# Patient Record
Sex: Male | Born: 1945 | Race: White | Hispanic: No | Smoking: Never smoker
Health system: Southern US, Community
[De-identification: ages and names within clinical notes are randomized; demographics above are authoritative.]

## PROBLEM LIST (undated history)

## (undated) DIAGNOSIS — D649 Anemia, unspecified: Secondary | ICD-10-CM

## (undated) DIAGNOSIS — I10 Essential (primary) hypertension: Secondary | ICD-10-CM

## (undated) DIAGNOSIS — N200 Calculus of kidney: Secondary | ICD-10-CM

## (undated) DIAGNOSIS — M1990 Unspecified osteoarthritis, unspecified site: Secondary | ICD-10-CM

## (undated) HISTORY — PX: TONSILLECTOMY: SUR1361

---

## 2009-11-04 HISTORY — PX: COLONOSCOPY: SHX174

## 2010-05-28 ENCOUNTER — Ambulatory Visit: Payer: Self-pay | Admitting: Unknown Physician Specialty

## 2012-05-15 ENCOUNTER — Ambulatory Visit: Payer: Self-pay | Admitting: Rheumatology

## 2013-10-18 ENCOUNTER — Other Ambulatory Visit: Payer: Self-pay | Admitting: Rheumatology

## 2013-10-18 LAB — BODY FLUID CELL COUNT WITH DIFFERENTIAL
Basophil: 0 %
Eosinophil: 0 %
Neutrophils: 24 %
Nucleated Cell Count: 240 /mm3
Other Mononuclear Cells: 24 %

## 2013-10-18 LAB — SYNOVIAL FLUID, CRYSTAL

## 2014-10-31 DIAGNOSIS — D649 Anemia, unspecified: Secondary | ICD-10-CM | POA: Insufficient documentation

## 2014-10-31 DIAGNOSIS — G629 Polyneuropathy, unspecified: Secondary | ICD-10-CM | POA: Insufficient documentation

## 2014-11-04 HISTORY — PX: EYE SURGERY: SHX253

## 2015-12-11 DIAGNOSIS — R202 Paresthesia of skin: Secondary | ICD-10-CM | POA: Diagnosis not present

## 2015-12-11 DIAGNOSIS — E538 Deficiency of other specified B group vitamins: Secondary | ICD-10-CM | POA: Diagnosis not present

## 2015-12-11 DIAGNOSIS — I10 Essential (primary) hypertension: Secondary | ICD-10-CM | POA: Diagnosis not present

## 2015-12-11 DIAGNOSIS — G252 Other specified forms of tremor: Secondary | ICD-10-CM | POA: Diagnosis not present

## 2016-01-01 DIAGNOSIS — H25011 Cortical age-related cataract, right eye: Secondary | ICD-10-CM | POA: Diagnosis not present

## 2016-01-01 DIAGNOSIS — H35032 Hypertensive retinopathy, left eye: Secondary | ICD-10-CM | POA: Diagnosis not present

## 2016-01-01 DIAGNOSIS — H35371 Puckering of macula, right eye: Secondary | ICD-10-CM | POA: Diagnosis not present

## 2016-01-01 DIAGNOSIS — H25012 Cortical age-related cataract, left eye: Secondary | ICD-10-CM | POA: Diagnosis not present

## 2016-01-01 DIAGNOSIS — H35031 Hypertensive retinopathy, right eye: Secondary | ICD-10-CM | POA: Diagnosis not present

## 2016-01-01 DIAGNOSIS — H2511 Age-related nuclear cataract, right eye: Secondary | ICD-10-CM | POA: Diagnosis not present

## 2016-01-01 DIAGNOSIS — H35372 Puckering of macula, left eye: Secondary | ICD-10-CM | POA: Diagnosis not present

## 2016-01-01 DIAGNOSIS — H2512 Age-related nuclear cataract, left eye: Secondary | ICD-10-CM | POA: Diagnosis not present

## 2016-01-22 DIAGNOSIS — R413 Other amnesia: Secondary | ICD-10-CM | POA: Diagnosis not present

## 2016-01-22 DIAGNOSIS — E538 Deficiency of other specified B group vitamins: Secondary | ICD-10-CM | POA: Insufficient documentation

## 2016-01-22 DIAGNOSIS — G250 Essential tremor: Secondary | ICD-10-CM | POA: Diagnosis not present

## 2016-01-30 DIAGNOSIS — H2512 Age-related nuclear cataract, left eye: Secondary | ICD-10-CM | POA: Diagnosis not present

## 2016-02-12 DIAGNOSIS — H2511 Age-related nuclear cataract, right eye: Secondary | ICD-10-CM | POA: Diagnosis not present

## 2016-02-12 DIAGNOSIS — H25011 Cortical age-related cataract, right eye: Secondary | ICD-10-CM | POA: Diagnosis not present

## 2016-02-19 DIAGNOSIS — I10 Essential (primary) hypertension: Secondary | ICD-10-CM | POA: Diagnosis not present

## 2016-02-19 DIAGNOSIS — D649 Anemia, unspecified: Secondary | ICD-10-CM | POA: Diagnosis not present

## 2016-02-19 DIAGNOSIS — N183 Chronic kidney disease, stage 3 (moderate): Secondary | ICD-10-CM | POA: Diagnosis not present

## 2016-02-19 DIAGNOSIS — M109 Gout, unspecified: Secondary | ICD-10-CM | POA: Diagnosis not present

## 2016-02-19 DIAGNOSIS — N200 Calculus of kidney: Secondary | ICD-10-CM | POA: Diagnosis not present

## 2016-02-19 DIAGNOSIS — N2581 Secondary hyperparathyroidism of renal origin: Secondary | ICD-10-CM | POA: Diagnosis not present

## 2016-02-20 DIAGNOSIS — H2511 Age-related nuclear cataract, right eye: Secondary | ICD-10-CM | POA: Diagnosis not present

## 2016-02-26 DIAGNOSIS — M109 Gout, unspecified: Secondary | ICD-10-CM | POA: Diagnosis not present

## 2016-02-26 DIAGNOSIS — I10 Essential (primary) hypertension: Secondary | ICD-10-CM | POA: Diagnosis not present

## 2016-02-26 DIAGNOSIS — N183 Chronic kidney disease, stage 3 (moderate): Secondary | ICD-10-CM | POA: Diagnosis not present

## 2016-03-18 DIAGNOSIS — E538 Deficiency of other specified B group vitamins: Secondary | ICD-10-CM | POA: Diagnosis not present

## 2016-03-18 DIAGNOSIS — G250 Essential tremor: Secondary | ICD-10-CM | POA: Diagnosis not present

## 2016-06-19 DIAGNOSIS — N189 Chronic kidney disease, unspecified: Secondary | ICD-10-CM | POA: Diagnosis not present

## 2016-06-19 DIAGNOSIS — D538 Other specified nutritional anemias: Secondary | ICD-10-CM | POA: Diagnosis not present

## 2016-06-19 DIAGNOSIS — N200 Calculus of kidney: Secondary | ICD-10-CM | POA: Diagnosis not present

## 2016-06-19 DIAGNOSIS — I10 Essential (primary) hypertension: Secondary | ICD-10-CM | POA: Diagnosis not present

## 2016-06-19 DIAGNOSIS — Z0000 Encounter for general adult medical examination without abnormal findings: Secondary | ICD-10-CM | POA: Diagnosis not present

## 2016-06-24 DIAGNOSIS — E538 Deficiency of other specified B group vitamins: Secondary | ICD-10-CM | POA: Diagnosis not present

## 2016-06-24 DIAGNOSIS — N183 Chronic kidney disease, stage 3 (moderate): Secondary | ICD-10-CM | POA: Diagnosis not present

## 2016-06-24 DIAGNOSIS — I10 Essential (primary) hypertension: Secondary | ICD-10-CM | POA: Diagnosis not present

## 2016-06-24 DIAGNOSIS — D538 Other specified nutritional anemias: Secondary | ICD-10-CM | POA: Diagnosis not present

## 2016-06-24 DIAGNOSIS — G250 Essential tremor: Secondary | ICD-10-CM | POA: Diagnosis not present

## 2016-07-17 DIAGNOSIS — D649 Anemia, unspecified: Secondary | ICD-10-CM | POA: Diagnosis not present

## 2016-08-19 DIAGNOSIS — G252 Other specified forms of tremor: Secondary | ICD-10-CM | POA: Diagnosis not present

## 2016-08-19 DIAGNOSIS — R413 Other amnesia: Secondary | ICD-10-CM | POA: Diagnosis not present

## 2016-08-19 DIAGNOSIS — E538 Deficiency of other specified B group vitamins: Secondary | ICD-10-CM | POA: Diagnosis not present

## 2016-08-26 DIAGNOSIS — D649 Anemia, unspecified: Secondary | ICD-10-CM | POA: Diagnosis not present

## 2016-08-26 DIAGNOSIS — N2581 Secondary hyperparathyroidism of renal origin: Secondary | ICD-10-CM | POA: Diagnosis not present

## 2016-08-26 DIAGNOSIS — M109 Gout, unspecified: Secondary | ICD-10-CM | POA: Diagnosis not present

## 2016-08-26 DIAGNOSIS — N200 Calculus of kidney: Secondary | ICD-10-CM | POA: Diagnosis not present

## 2016-08-26 DIAGNOSIS — N183 Chronic kidney disease, stage 3 (moderate): Secondary | ICD-10-CM | POA: Diagnosis not present

## 2016-08-26 DIAGNOSIS — I10 Essential (primary) hypertension: Secondary | ICD-10-CM | POA: Diagnosis not present

## 2016-09-02 DIAGNOSIS — I129 Hypertensive chronic kidney disease with stage 1 through stage 4 chronic kidney disease, or unspecified chronic kidney disease: Secondary | ICD-10-CM | POA: Diagnosis not present

## 2016-09-02 DIAGNOSIS — M109 Gout, unspecified: Secondary | ICD-10-CM | POA: Diagnosis not present

## 2016-09-02 DIAGNOSIS — N183 Chronic kidney disease, stage 3 (moderate): Secondary | ICD-10-CM | POA: Diagnosis not present

## 2016-09-23 DIAGNOSIS — H26493 Other secondary cataract, bilateral: Secondary | ICD-10-CM | POA: Diagnosis not present

## 2016-12-23 DIAGNOSIS — G250 Essential tremor: Secondary | ICD-10-CM | POA: Diagnosis not present

## 2016-12-23 DIAGNOSIS — N183 Chronic kidney disease, stage 3 (moderate): Secondary | ICD-10-CM | POA: Diagnosis not present

## 2016-12-23 DIAGNOSIS — I10 Essential (primary) hypertension: Secondary | ICD-10-CM | POA: Diagnosis not present

## 2016-12-23 DIAGNOSIS — E538 Deficiency of other specified B group vitamins: Secondary | ICD-10-CM | POA: Diagnosis not present

## 2016-12-30 DIAGNOSIS — I10 Essential (primary) hypertension: Secondary | ICD-10-CM | POA: Diagnosis not present

## 2016-12-30 DIAGNOSIS — N200 Calculus of kidney: Secondary | ICD-10-CM | POA: Diagnosis not present

## 2016-12-30 DIAGNOSIS — Z0000 Encounter for general adult medical examination without abnormal findings: Secondary | ICD-10-CM | POA: Diagnosis not present

## 2016-12-30 DIAGNOSIS — N183 Chronic kidney disease, stage 3 (moderate): Secondary | ICD-10-CM | POA: Diagnosis not present

## 2017-03-03 DIAGNOSIS — N2581 Secondary hyperparathyroidism of renal origin: Secondary | ICD-10-CM | POA: Diagnosis not present

## 2017-03-03 DIAGNOSIS — N183 Chronic kidney disease, stage 3 (moderate): Secondary | ICD-10-CM | POA: Diagnosis not present

## 2017-03-03 DIAGNOSIS — M109 Gout, unspecified: Secondary | ICD-10-CM | POA: Diagnosis not present

## 2017-03-03 DIAGNOSIS — D649 Anemia, unspecified: Secondary | ICD-10-CM | POA: Diagnosis not present

## 2017-03-10 DIAGNOSIS — N183 Chronic kidney disease, stage 3 (moderate): Secondary | ICD-10-CM | POA: Diagnosis not present

## 2017-03-10 DIAGNOSIS — I129 Hypertensive chronic kidney disease with stage 1 through stage 4 chronic kidney disease, or unspecified chronic kidney disease: Secondary | ICD-10-CM | POA: Diagnosis not present

## 2017-03-10 DIAGNOSIS — M109 Gout, unspecified: Secondary | ICD-10-CM | POA: Diagnosis not present

## 2017-04-21 DIAGNOSIS — L80 Vitiligo: Secondary | ICD-10-CM | POA: Diagnosis not present

## 2017-06-24 DIAGNOSIS — Z1159 Encounter for screening for other viral diseases: Secondary | ICD-10-CM | POA: Diagnosis not present

## 2017-06-24 DIAGNOSIS — I10 Essential (primary) hypertension: Secondary | ICD-10-CM | POA: Diagnosis not present

## 2017-06-24 DIAGNOSIS — N183 Chronic kidney disease, stage 3 (moderate): Secondary | ICD-10-CM | POA: Diagnosis not present

## 2017-06-24 DIAGNOSIS — N200 Calculus of kidney: Secondary | ICD-10-CM | POA: Diagnosis not present

## 2017-06-30 DIAGNOSIS — G629 Polyneuropathy, unspecified: Secondary | ICD-10-CM | POA: Diagnosis not present

## 2017-06-30 DIAGNOSIS — I10 Essential (primary) hypertension: Secondary | ICD-10-CM | POA: Diagnosis not present

## 2017-06-30 DIAGNOSIS — L80 Vitiligo: Secondary | ICD-10-CM | POA: Insufficient documentation

## 2017-06-30 DIAGNOSIS — N183 Chronic kidney disease, stage 3 (moderate): Secondary | ICD-10-CM | POA: Diagnosis not present

## 2017-08-11 DIAGNOSIS — H26493 Other secondary cataract, bilateral: Secondary | ICD-10-CM | POA: Diagnosis not present

## 2017-09-01 DIAGNOSIS — E538 Deficiency of other specified B group vitamins: Secondary | ICD-10-CM | POA: Diagnosis not present

## 2017-09-01 DIAGNOSIS — R251 Tremor, unspecified: Secondary | ICD-10-CM | POA: Insufficient documentation

## 2017-09-01 DIAGNOSIS — R413 Other amnesia: Secondary | ICD-10-CM | POA: Diagnosis not present

## 2017-09-08 DIAGNOSIS — N183 Chronic kidney disease, stage 3 (moderate): Secondary | ICD-10-CM | POA: Diagnosis not present

## 2017-09-08 DIAGNOSIS — M109 Gout, unspecified: Secondary | ICD-10-CM | POA: Diagnosis not present

## 2017-09-08 DIAGNOSIS — N2581 Secondary hyperparathyroidism of renal origin: Secondary | ICD-10-CM | POA: Diagnosis not present

## 2017-09-08 DIAGNOSIS — D649 Anemia, unspecified: Secondary | ICD-10-CM | POA: Diagnosis not present

## 2017-09-15 DIAGNOSIS — M109 Gout, unspecified: Secondary | ICD-10-CM | POA: Diagnosis not present

## 2017-09-15 DIAGNOSIS — N183 Chronic kidney disease, stage 3 (moderate): Secondary | ICD-10-CM | POA: Diagnosis not present

## 2017-09-15 DIAGNOSIS — I129 Hypertensive chronic kidney disease with stage 1 through stage 4 chronic kidney disease, or unspecified chronic kidney disease: Secondary | ICD-10-CM | POA: Diagnosis not present

## 2017-09-29 DIAGNOSIS — N183 Chronic kidney disease, stage 3 (moderate): Secondary | ICD-10-CM | POA: Diagnosis not present

## 2017-09-29 DIAGNOSIS — E7801 Familial hypercholesterolemia: Secondary | ICD-10-CM | POA: Diagnosis not present

## 2017-09-29 DIAGNOSIS — L300 Nummular dermatitis: Secondary | ICD-10-CM | POA: Diagnosis not present

## 2017-09-29 DIAGNOSIS — L821 Other seborrheic keratosis: Secondary | ICD-10-CM | POA: Diagnosis not present

## 2017-09-29 DIAGNOSIS — L80 Vitiligo: Secondary | ICD-10-CM | POA: Diagnosis not present

## 2017-09-29 DIAGNOSIS — L853 Xerosis cutis: Secondary | ICD-10-CM | POA: Diagnosis not present

## 2017-11-17 DIAGNOSIS — R251 Tremor, unspecified: Secondary | ICD-10-CM | POA: Diagnosis not present

## 2017-11-17 DIAGNOSIS — E538 Deficiency of other specified B group vitamins: Secondary | ICD-10-CM | POA: Diagnosis not present

## 2017-11-17 DIAGNOSIS — R413 Other amnesia: Secondary | ICD-10-CM | POA: Diagnosis not present

## 2017-12-22 DIAGNOSIS — E7801 Familial hypercholesterolemia: Secondary | ICD-10-CM | POA: Diagnosis not present

## 2017-12-22 DIAGNOSIS — N183 Chronic kidney disease, stage 3 (moderate): Secondary | ICD-10-CM | POA: Diagnosis not present

## 2017-12-29 DIAGNOSIS — N183 Chronic kidney disease, stage 3 (moderate): Secondary | ICD-10-CM | POA: Diagnosis not present

## 2017-12-29 DIAGNOSIS — G629 Polyneuropathy, unspecified: Secondary | ICD-10-CM | POA: Diagnosis not present

## 2017-12-29 DIAGNOSIS — I10 Essential (primary) hypertension: Secondary | ICD-10-CM | POA: Diagnosis not present

## 2017-12-29 DIAGNOSIS — Z0000 Encounter for general adult medical examination without abnormal findings: Secondary | ICD-10-CM | POA: Diagnosis not present

## 2018-03-05 DIAGNOSIS — N183 Chronic kidney disease, stage 3 (moderate): Secondary | ICD-10-CM | POA: Diagnosis not present

## 2018-03-05 DIAGNOSIS — I10 Essential (primary) hypertension: Secondary | ICD-10-CM | POA: Diagnosis not present

## 2018-03-05 DIAGNOSIS — M109 Gout, unspecified: Secondary | ICD-10-CM | POA: Diagnosis not present

## 2018-03-05 DIAGNOSIS — N2581 Secondary hyperparathyroidism of renal origin: Secondary | ICD-10-CM | POA: Diagnosis not present

## 2018-03-09 DIAGNOSIS — N183 Chronic kidney disease, stage 3 (moderate): Secondary | ICD-10-CM | POA: Diagnosis not present

## 2018-03-09 DIAGNOSIS — I129 Hypertensive chronic kidney disease with stage 1 through stage 4 chronic kidney disease, or unspecified chronic kidney disease: Secondary | ICD-10-CM | POA: Diagnosis not present

## 2018-03-09 DIAGNOSIS — M109 Gout, unspecified: Secondary | ICD-10-CM | POA: Diagnosis not present

## 2018-04-06 DIAGNOSIS — L80 Vitiligo: Secondary | ICD-10-CM | POA: Diagnosis not present

## 2018-05-18 DIAGNOSIS — R413 Other amnesia: Secondary | ICD-10-CM | POA: Diagnosis not present

## 2018-05-18 DIAGNOSIS — E538 Deficiency of other specified B group vitamins: Secondary | ICD-10-CM | POA: Diagnosis not present

## 2018-05-18 DIAGNOSIS — R251 Tremor, unspecified: Secondary | ICD-10-CM | POA: Diagnosis not present

## 2018-06-22 DIAGNOSIS — I10 Essential (primary) hypertension: Secondary | ICD-10-CM | POA: Diagnosis not present

## 2018-06-22 DIAGNOSIS — N183 Chronic kidney disease, stage 3 (moderate): Secondary | ICD-10-CM | POA: Diagnosis not present

## 2018-06-22 DIAGNOSIS — E538 Deficiency of other specified B group vitamins: Secondary | ICD-10-CM | POA: Diagnosis not present

## 2018-06-22 DIAGNOSIS — G629 Polyneuropathy, unspecified: Secondary | ICD-10-CM | POA: Diagnosis not present

## 2018-06-29 DIAGNOSIS — G629 Polyneuropathy, unspecified: Secondary | ICD-10-CM | POA: Diagnosis not present

## 2018-06-29 DIAGNOSIS — N183 Chronic kidney disease, stage 3 (moderate): Secondary | ICD-10-CM | POA: Diagnosis not present

## 2018-06-29 DIAGNOSIS — E538 Deficiency of other specified B group vitamins: Secondary | ICD-10-CM | POA: Diagnosis not present

## 2018-06-29 DIAGNOSIS — I10 Essential (primary) hypertension: Secondary | ICD-10-CM | POA: Diagnosis not present

## 2018-08-24 DIAGNOSIS — E538 Deficiency of other specified B group vitamins: Secondary | ICD-10-CM | POA: Diagnosis not present

## 2018-08-24 DIAGNOSIS — R413 Other amnesia: Secondary | ICD-10-CM | POA: Diagnosis not present

## 2018-08-24 DIAGNOSIS — R251 Tremor, unspecified: Secondary | ICD-10-CM | POA: Diagnosis not present

## 2018-08-28 DIAGNOSIS — I10 Essential (primary) hypertension: Secondary | ICD-10-CM | POA: Diagnosis not present

## 2018-08-28 DIAGNOSIS — N183 Chronic kidney disease, stage 3 (moderate): Secondary | ICD-10-CM | POA: Diagnosis not present

## 2018-08-28 DIAGNOSIS — N200 Calculus of kidney: Secondary | ICD-10-CM | POA: Diagnosis not present

## 2018-08-28 DIAGNOSIS — M109 Gout, unspecified: Secondary | ICD-10-CM | POA: Diagnosis not present

## 2018-09-07 DIAGNOSIS — N183 Chronic kidney disease, stage 3 (moderate): Secondary | ICD-10-CM | POA: Diagnosis not present

## 2018-09-07 DIAGNOSIS — I129 Hypertensive chronic kidney disease with stage 1 through stage 4 chronic kidney disease, or unspecified chronic kidney disease: Secondary | ICD-10-CM | POA: Diagnosis not present

## 2018-09-07 DIAGNOSIS — M109 Gout, unspecified: Secondary | ICD-10-CM | POA: Diagnosis not present

## 2018-11-02 DIAGNOSIS — L80 Vitiligo: Secondary | ICD-10-CM | POA: Diagnosis not present

## 2018-11-02 DIAGNOSIS — L300 Nummular dermatitis: Secondary | ICD-10-CM | POA: Diagnosis not present

## 2018-11-23 DIAGNOSIS — R413 Other amnesia: Secondary | ICD-10-CM | POA: Diagnosis not present

## 2018-11-23 DIAGNOSIS — F419 Anxiety disorder, unspecified: Secondary | ICD-10-CM | POA: Diagnosis not present

## 2018-11-23 DIAGNOSIS — E538 Deficiency of other specified B group vitamins: Secondary | ICD-10-CM | POA: Diagnosis not present

## 2018-11-23 DIAGNOSIS — R251 Tremor, unspecified: Secondary | ICD-10-CM | POA: Diagnosis not present

## 2018-12-28 DIAGNOSIS — N183 Chronic kidney disease, stage 3 (moderate): Secondary | ICD-10-CM | POA: Diagnosis not present

## 2018-12-28 DIAGNOSIS — E785 Hyperlipidemia, unspecified: Secondary | ICD-10-CM | POA: Diagnosis not present

## 2019-01-04 DIAGNOSIS — Z0000 Encounter for general adult medical examination without abnormal findings: Secondary | ICD-10-CM | POA: Diagnosis not present

## 2019-03-31 DIAGNOSIS — D649 Anemia, unspecified: Secondary | ICD-10-CM | POA: Diagnosis not present

## 2019-03-31 DIAGNOSIS — N183 Chronic kidney disease, stage 3 (moderate): Secondary | ICD-10-CM | POA: Diagnosis not present

## 2019-03-31 DIAGNOSIS — M109 Gout, unspecified: Secondary | ICD-10-CM | POA: Diagnosis not present

## 2019-03-31 DIAGNOSIS — N2581 Secondary hyperparathyroidism of renal origin: Secondary | ICD-10-CM | POA: Diagnosis not present

## 2019-04-05 DIAGNOSIS — M109 Gout, unspecified: Secondary | ICD-10-CM | POA: Diagnosis not present

## 2019-04-05 DIAGNOSIS — N183 Chronic kidney disease, stage 3 (moderate): Secondary | ICD-10-CM | POA: Diagnosis not present

## 2019-04-05 DIAGNOSIS — I129 Hypertensive chronic kidney disease with stage 1 through stage 4 chronic kidney disease, or unspecified chronic kidney disease: Secondary | ICD-10-CM | POA: Diagnosis not present

## 2019-06-28 DIAGNOSIS — N183 Chronic kidney disease, stage 3 (moderate): Secondary | ICD-10-CM | POA: Diagnosis not present

## 2019-07-05 DIAGNOSIS — N183 Chronic kidney disease, stage 3 (moderate): Secondary | ICD-10-CM | POA: Diagnosis not present

## 2019-07-05 DIAGNOSIS — I10 Essential (primary) hypertension: Secondary | ICD-10-CM | POA: Diagnosis not present

## 2019-07-05 DIAGNOSIS — R413 Other amnesia: Secondary | ICD-10-CM | POA: Diagnosis not present

## 2019-07-05 DIAGNOSIS — G629 Polyneuropathy, unspecified: Secondary | ICD-10-CM | POA: Diagnosis not present

## 2019-10-11 DIAGNOSIS — N1832 Chronic kidney disease, stage 3b: Secondary | ICD-10-CM | POA: Insufficient documentation

## 2019-10-11 DIAGNOSIS — I129 Hypertensive chronic kidney disease with stage 1 through stage 4 chronic kidney disease, or unspecified chronic kidney disease: Secondary | ICD-10-CM | POA: Insufficient documentation

## 2019-10-11 DIAGNOSIS — M109 Gout, unspecified: Secondary | ICD-10-CM | POA: Insufficient documentation

## 2020-05-18 ENCOUNTER — Other Ambulatory Visit: Payer: Self-pay | Admitting: Internal Medicine

## 2020-05-18 ENCOUNTER — Other Ambulatory Visit: Payer: Self-pay

## 2020-05-18 ENCOUNTER — Encounter: Payer: Self-pay | Admitting: Emergency Medicine

## 2020-05-18 ENCOUNTER — Inpatient Hospital Stay
Admission: EM | Admit: 2020-05-18 | Discharge: 2020-05-20 | DRG: 854 | Disposition: A | Discharge status: Home / Self Care | Payer: Medicare Other | Attending: Internal Medicine | Admitting: Internal Medicine

## 2020-05-18 ENCOUNTER — Ambulatory Visit
Admission: RE | Admit: 2020-05-18 | Discharge: 2020-05-18 | Disposition: A | Discharge status: Home / Self Care | Payer: Medicare Other | Source: Ambulatory Visit | Attending: Internal Medicine | Admitting: Internal Medicine

## 2020-05-18 DIAGNOSIS — A419 Sepsis, unspecified organism: Principal | ICD-10-CM | POA: Diagnosis present

## 2020-05-18 DIAGNOSIS — D72829 Elevated white blood cell count, unspecified: Secondary | ICD-10-CM

## 2020-05-18 DIAGNOSIS — N139 Obstructive and reflux uropathy, unspecified: Secondary | ICD-10-CM

## 2020-05-18 DIAGNOSIS — E875 Hyperkalemia: Secondary | ICD-10-CM | POA: Diagnosis present

## 2020-05-18 DIAGNOSIS — R1032 Left lower quadrant pain: Secondary | ICD-10-CM

## 2020-05-18 DIAGNOSIS — N200 Calculus of kidney: Secondary | ICD-10-CM | POA: Insufficient documentation

## 2020-05-18 DIAGNOSIS — N183 Chronic kidney disease, stage 3 unspecified: Secondary | ICD-10-CM | POA: Diagnosis present

## 2020-05-18 DIAGNOSIS — M109 Gout, unspecified: Secondary | ICD-10-CM | POA: Diagnosis present

## 2020-05-18 DIAGNOSIS — R109 Unspecified abdominal pain: Secondary | ICD-10-CM | POA: Diagnosis not present

## 2020-05-18 DIAGNOSIS — E785 Hyperlipidemia, unspecified: Secondary | ICD-10-CM | POA: Diagnosis present

## 2020-05-18 DIAGNOSIS — N179 Acute kidney failure, unspecified: Secondary | ICD-10-CM

## 2020-05-18 DIAGNOSIS — F419 Anxiety disorder, unspecified: Secondary | ICD-10-CM | POA: Diagnosis present

## 2020-05-18 DIAGNOSIS — Z20822 Contact with and (suspected) exposure to covid-19: Secondary | ICD-10-CM | POA: Diagnosis present

## 2020-05-18 DIAGNOSIS — N136 Pyonephrosis: Secondary | ICD-10-CM | POA: Diagnosis present

## 2020-05-18 DIAGNOSIS — I129 Hypertensive chronic kidney disease with stage 1 through stage 4 chronic kidney disease, or unspecified chronic kidney disease: Secondary | ICD-10-CM | POA: Diagnosis present

## 2020-05-18 HISTORY — DX: Calculus of kidney: N20.0

## 2020-05-18 LAB — BASIC METABOLIC PANEL
Anion gap: 11 (ref 5–15)
BUN: 56 mg/dL — ABNORMAL HIGH (ref 8–23)
CO2: 22 mmol/L (ref 22–32)
Calcium: 9.3 mg/dL (ref 8.9–10.3)
Chloride: 102 mmol/L (ref 98–111)
Creatinine, Ser: 5.88 mg/dL — ABNORMAL HIGH (ref 0.61–1.24)
GFR calc Af Amer: 10 mL/min — ABNORMAL LOW (ref >60)
GFR calc non Af Amer: 9 mL/min — ABNORMAL LOW (ref >60)
Glucose, Bld: 115 mg/dL — ABNORMAL HIGH (ref 70–99)
Potassium: 5.2 mmol/L — ABNORMAL HIGH (ref 3.5–5.1)
Sodium: 135 mmol/L (ref 135–145)

## 2020-05-18 LAB — CBC
HCT: 42.1 % (ref 39.0–52.0)
Hemoglobin: 14.4 g/dL (ref 13.0–17.0)
MCH: 32.7 pg (ref 26.0–34.0)
MCHC: 34.2 g/dL (ref 30.0–36.0)
MCV: 95.7 fL (ref 80.0–100.0)
Platelets: 200 10*3/uL (ref 150–400)
RBC: 4.4 MIL/uL (ref 4.22–5.81)
RDW: 13.5 % (ref 11.5–15.5)
WBC: 20.3 10*3/uL — ABNORMAL HIGH (ref 4.0–10.5)
nRBC: 0 % (ref 0.0–0.2)

## 2020-05-18 NOTE — ED Triage Notes (Signed)
First Nurse Note:  Arrives for ED evaluation of kidney stone, hydronephrosis, and AKI.  Per report patient has had outpatient CT scan and blood work.  Creatinine elevated per report.  Unable to locate outpatient bloodwork results.

## 2020-05-18 NOTE — ED Triage Notes (Signed)
Pt here with c/o kidney stones bilateral kidneys per CT scan earlier today, left hydronephrosis, c/o LLQ abd pain, no painful urination today, just decreased. Hx of kidney stones years ago, hasn't had any since. Pt walked to triage with no concerns, NAD.

## 2020-05-18 NOTE — ED Notes (Signed)
Lab work drawn at [facility] earlier today, unable to find results at this time, however, pt states results had come back to the dr prior to his arrival to ED.

## 2020-05-19 ENCOUNTER — Encounter: Payer: Self-pay | Admitting: Urology

## 2020-05-19 ENCOUNTER — Inpatient Hospital Stay: Payer: Medicare Other | Admitting: Anesthesiology

## 2020-05-19 ENCOUNTER — Encounter
Admission: EM | Disposition: A | Discharge status: Home / Self Care | Payer: Self-pay | Source: Home / Self Care | Attending: Internal Medicine

## 2020-05-19 DIAGNOSIS — N390 Urinary tract infection, site not specified: Secondary | ICD-10-CM

## 2020-05-19 DIAGNOSIS — I10 Essential (primary) hypertension: Secondary | ICD-10-CM | POA: Diagnosis not present

## 2020-05-19 DIAGNOSIS — R109 Unspecified abdominal pain: Secondary | ICD-10-CM | POA: Diagnosis present

## 2020-05-19 DIAGNOSIS — E785 Hyperlipidemia, unspecified: Secondary | ICD-10-CM | POA: Diagnosis present

## 2020-05-19 DIAGNOSIS — N179 Acute kidney failure, unspecified: Secondary | ICD-10-CM | POA: Diagnosis present

## 2020-05-19 DIAGNOSIS — N183 Chronic kidney disease, stage 3 unspecified: Secondary | ICD-10-CM | POA: Diagnosis present

## 2020-05-19 DIAGNOSIS — F419 Anxiety disorder, unspecified: Secondary | ICD-10-CM | POA: Diagnosis present

## 2020-05-19 DIAGNOSIS — E875 Hyperkalemia: Secondary | ICD-10-CM

## 2020-05-19 DIAGNOSIS — I129 Hypertensive chronic kidney disease with stage 1 through stage 4 chronic kidney disease, or unspecified chronic kidney disease: Secondary | ICD-10-CM | POA: Diagnosis present

## 2020-05-19 DIAGNOSIS — N201 Calculus of ureter: Secondary | ICD-10-CM

## 2020-05-19 DIAGNOSIS — M109 Gout, unspecified: Secondary | ICD-10-CM | POA: Diagnosis present

## 2020-05-19 DIAGNOSIS — N139 Obstructive and reflux uropathy, unspecified: Secondary | ICD-10-CM | POA: Diagnosis present

## 2020-05-19 DIAGNOSIS — A419 Sepsis, unspecified organism: Secondary | ICD-10-CM | POA: Diagnosis present

## 2020-05-19 DIAGNOSIS — N136 Pyonephrosis: Secondary | ICD-10-CM | POA: Diagnosis present

## 2020-05-19 DIAGNOSIS — Z20822 Contact with and (suspected) exposure to covid-19: Secondary | ICD-10-CM | POA: Diagnosis present

## 2020-05-19 DIAGNOSIS — D72829 Elevated white blood cell count, unspecified: Secondary | ICD-10-CM | POA: Diagnosis not present

## 2020-05-19 HISTORY — PX: CYSTOSCOPY WITH STENT PLACEMENT: SHX5790

## 2020-05-19 HISTORY — PX: CYSTOSCOPY W/ RETROGRADES: SHX1426

## 2020-05-19 LAB — URINALYSIS, COMPLETE (UACMP) WITH MICROSCOPIC
Bacteria, UA: NONE SEEN
Bilirubin Urine: NEGATIVE
Glucose, UA: 50 mg/dL — AB
Ketones, ur: NEGATIVE mg/dL
Nitrite: NEGATIVE
Protein, ur: >=300 mg/dL — AB
RBC / HPF: >50 RBC/hpf — ABNORMAL HIGH (ref 0–5)
Specific Gravity, Urine: 1.009 (ref 1.005–1.030)
WBC, UA: >50 WBC/hpf — ABNORMAL HIGH (ref 0–5)
pH: 7 (ref 5.0–8.0)

## 2020-05-19 LAB — BASIC METABOLIC PANEL
Anion gap: 11 (ref 5–15)
Anion gap: 8 (ref 5–15)
BUN: 56 mg/dL — ABNORMAL HIGH (ref 8–23)
BUN: 53 mg/dL — ABNORMAL HIGH (ref 8–23)
CO2: 20 mmol/L — ABNORMAL LOW (ref 22–32)
CO2: 22 mmol/L (ref 22–32)
Calcium: 8.5 mg/dL — ABNORMAL LOW (ref 8.9–10.3)
Calcium: 8.3 mg/dL — ABNORMAL LOW (ref 8.9–10.3)
Chloride: 105 mmol/L (ref 98–111)
Chloride: 106 mmol/L (ref 98–111)
Creatinine, Ser: 6.2 mg/dL — ABNORMAL HIGH (ref 0.61–1.24)
Creatinine, Ser: 4.89 mg/dL — ABNORMAL HIGH (ref 0.61–1.24)
GFR calc Af Amer: 9 mL/min — ABNORMAL LOW (ref >60)
GFR calc Af Amer: 12 mL/min — ABNORMAL LOW (ref >60)
GFR calc non Af Amer: 8 mL/min — ABNORMAL LOW (ref >60)
GFR calc non Af Amer: 11 mL/min — ABNORMAL LOW (ref >60)
Glucose, Bld: 118 mg/dL — ABNORMAL HIGH (ref 70–99)
Glucose, Bld: 107 mg/dL — ABNORMAL HIGH (ref 70–99)
Potassium: 4.9 mmol/L (ref 3.5–5.1)
Potassium: 4.5 mmol/L (ref 3.5–5.1)
Sodium: 136 mmol/L (ref 135–145)
Sodium: 136 mmol/L (ref 135–145)

## 2020-05-19 LAB — LIPASE, BLOOD: Lipase: 25 U/L (ref 11–51)

## 2020-05-19 LAB — CBC
HCT: 35.8 % — ABNORMAL LOW (ref 39.0–52.0)
Hemoglobin: 12.3 g/dL — ABNORMAL LOW (ref 13.0–17.0)
MCH: 33.1 pg (ref 26.0–34.0)
MCHC: 34.4 g/dL (ref 30.0–36.0)
MCV: 96.2 fL (ref 80.0–100.0)
Platelets: 155 10*3/uL (ref 150–400)
RBC: 3.72 MIL/uL — ABNORMAL LOW (ref 4.22–5.81)
RDW: 13.4 % (ref 11.5–15.5)
WBC: 20.3 10*3/uL — ABNORMAL HIGH (ref 4.0–10.5)
nRBC: 0 % (ref 0.0–0.2)

## 2020-05-19 LAB — SARS CORONAVIRUS 2 BY RT PCR (HOSPITAL ORDER, PERFORMED IN ~~LOC~~ HOSPITAL LAB): SARS Coronavirus 2: NEGATIVE

## 2020-05-19 LAB — HEPATIC FUNCTION PANEL
ALT: 31 U/L (ref 0–44)
AST: 48 U/L — ABNORMAL HIGH (ref 15–41)
Albumin: 4.4 g/dL (ref 3.5–5.0)
Alkaline Phosphatase: 67 U/L (ref 38–126)
Bilirubin, Direct: 0.4 mg/dL — ABNORMAL HIGH (ref 0.0–0.2)
Indirect Bilirubin: 2.4 mg/dL — ABNORMAL HIGH (ref 0.3–0.9)
Total Bilirubin: 2.8 mg/dL — ABNORMAL HIGH (ref 0.3–1.2)
Total Protein: 7.9 g/dL (ref 6.5–8.1)

## 2020-05-19 LAB — LACTIC ACID, PLASMA
Lactic Acid, Venous: 1.4 mmol/L (ref 0.5–1.9)
Lactic Acid, Venous: 1.5 mmol/L (ref 0.5–1.9)
Lactic Acid, Venous: 1.6 mmol/L (ref 0.5–1.9)
Lactic Acid, Venous: 1.6 mmol/L (ref 0.5–1.9)

## 2020-05-19 LAB — PROCALCITONIN: Procalcitonin: <0.1 ng/mL

## 2020-05-19 SURGERY — CYSTOSCOPY, WITH STENT INSERTION
Anesthesia: General | Laterality: Left

## 2020-05-19 MED ORDER — SODIUM CHLORIDE 0.9 % IV SOLN
1.0000 g | INTRAVENOUS | Status: DC
  Administered 2020-05-19: 1 g via INTRAVENOUS
  Filled 2020-05-19: qty 10

## 2020-05-19 MED ORDER — LACTATED RINGERS IV BOLUS (SEPSIS)
1000.0000 mL | Freq: Once | INTRAVENOUS | Status: AC
  Administered 2020-05-19: 1000 mL via INTRAVENOUS

## 2020-05-19 MED ORDER — FENTANYL CITRATE (PF) 100 MCG/2ML IJ SOLN
INTRAMUSCULAR | Status: DC | PRN
  Administered 2020-05-19 (×2): 50 ug via INTRAVENOUS

## 2020-05-19 MED ORDER — ENOXAPARIN SODIUM 40 MG/0.4ML ~~LOC~~ SOLN: 40.0000 mg | SUBCUTANEOUS | Status: DC

## 2020-05-19 MED ORDER — TRAZODONE HCL 50 MG PO TABS: 25.0000 mg | ORAL_TABLET | Freq: Every evening | ORAL | Status: DC | PRN

## 2020-05-19 MED ORDER — PROPOFOL 10 MG/ML IV BOLUS
INTRAVENOUS | Status: DC | PRN
  Administered 2020-05-19: 150 mg via INTRAVENOUS

## 2020-05-19 MED ORDER — SODIUM CHLORIDE 0.9 % IV SOLN
INTRAVENOUS | Status: DC | PRN
Start: 2020-05-19 — End: 2020-05-19

## 2020-05-19 MED ORDER — CHLORHEXIDINE GLUCONATE CLOTH 2 % EX PADS
6.0000 | MEDICATED_PAD | Freq: Every day | CUTANEOUS | Status: DC
  Administered 2020-05-19 – 2020-05-20 (×2): 6 via TOPICAL

## 2020-05-19 MED ORDER — ONDANSETRON HCL 4 MG/2ML IJ SOLN
INTRAMUSCULAR | Status: DC | PRN
  Administered 2020-05-19: 4 mg via INTRAVENOUS

## 2020-05-19 MED ORDER — VITAMIN B-12 1000 MCG PO TABS
1000.0000 ug | ORAL_TABLET | Freq: Every day | ORAL | Status: DC
  Administered 2020-05-19 – 2020-05-20 (×2): 1000 ug via ORAL
  Filled 2020-05-19 (×2): qty 1

## 2020-05-19 MED ORDER — LABETALOL HCL 5 MG/ML IV SOLN: 20.0000 mg | INTRAVENOUS | Status: DC | PRN

## 2020-05-19 MED ORDER — PROPRANOLOL HCL 20 MG PO TABS
20.0000 mg | ORAL_TABLET | Freq: Two times a day (BID) | ORAL | Status: DC
  Administered 2020-05-19 – 2020-05-20 (×3): 20 mg via ORAL
  Filled 2020-05-19 (×4): qty 1

## 2020-05-19 MED ORDER — ONDANSETRON HCL 4 MG PO TABS: 4.0000 mg | ORAL_TABLET | Freq: Four times a day (QID) | ORAL | Status: DC | PRN

## 2020-05-19 MED ORDER — FENTANYL CITRATE (PF) 100 MCG/2ML IJ SOLN: 25.0000 ug | INTRAMUSCULAR | Status: DC | PRN

## 2020-05-19 MED ORDER — OXYCODONE HCL 5 MG PO TABS: 5.0000 mg | ORAL_TABLET | Freq: Once | ORAL | Status: DC | PRN

## 2020-05-19 MED ORDER — LISINOPRIL 5 MG PO TABS
5.0000 mg | ORAL_TABLET | Freq: Every day | ORAL | Status: DC
  Administered 2020-05-19 – 2020-05-20 (×2): 5 mg via ORAL
  Filled 2020-05-19 (×2): qty 1

## 2020-05-19 MED ORDER — FENTANYL CITRATE (PF) 100 MCG/2ML IJ SOLN
INTRAMUSCULAR | Status: AC
  Filled 2020-05-19: qty 2

## 2020-05-19 MED ORDER — IOHEXOL 180 MG/ML  SOLN
INTRAMUSCULAR | Status: DC | PRN
  Administered 2020-05-19: 15 mL

## 2020-05-19 MED ORDER — OXYCODONE HCL 5 MG/5ML PO SOLN: 5.0000 mg | Freq: Once | ORAL | Status: DC | PRN

## 2020-05-19 MED ORDER — SODIUM CHLORIDE 0.9 % IV SOLN: INTRAVENOUS | Status: DC

## 2020-05-19 MED ORDER — ONDANSETRON HCL 4 MG/2ML IJ SOLN: 4.0000 mg | Freq: Four times a day (QID) | INTRAMUSCULAR | Status: DC | PRN

## 2020-05-19 MED ORDER — MORPHINE SULFATE (PF) 2 MG/ML IV SOLN: 1.0000 mg | INTRAVENOUS | Status: DC | PRN

## 2020-05-19 MED ORDER — ACETAMINOPHEN 325 MG PO TABS: 650.0000 mg | ORAL_TABLET | Freq: Four times a day (QID) | ORAL | Status: DC | PRN

## 2020-05-19 MED ORDER — PHENYLEPHRINE HCL (PRESSORS) 10 MG/ML IV SOLN
INTRAVENOUS | Status: DC | PRN
  Administered 2020-05-19: 100 ug via INTRAVENOUS
  Administered 2020-05-19 (×2): 200 ug via INTRAVENOUS

## 2020-05-19 MED ORDER — ADULT MULTIVITAMIN W/MINERALS CH
1.0000 | ORAL_TABLET | Freq: Every day | ORAL | Status: DC
  Administered 2020-05-19 – 2020-05-20 (×2): 1 via ORAL
  Filled 2020-05-19 (×2): qty 1

## 2020-05-19 MED ORDER — BELLADONNA ALKALOIDS-OPIUM 16.2-60 MG RE SUPP: 1.0000 | Freq: Four times a day (QID) | RECTAL | Status: DC | PRN

## 2020-05-19 MED ORDER — ASPIRIN EC 81 MG PO TBEC
81.0000 mg | DELAYED_RELEASE_TABLET | Freq: Every day | ORAL | Status: DC
  Administered 2020-05-19 – 2020-05-20 (×2): 81 mg via ORAL
  Filled 2020-05-19 (×2): qty 1

## 2020-05-19 MED ORDER — MAGNESIUM HYDROXIDE 400 MG/5ML PO SUSP: 30.0000 mL | Freq: Every day | ORAL | Status: DC | PRN

## 2020-05-19 MED ORDER — PROPOFOL 10 MG/ML IV BOLUS
INTRAVENOUS | Status: AC
  Filled 2020-05-19: qty 20

## 2020-05-19 MED ORDER — SODIUM CHLORIDE 0.9 % IV SOLN
2.0000 g | INTRAVENOUS | Status: DC
  Administered 2020-05-20: 2 g via INTRAVENOUS
  Filled 2020-05-19: qty 20
  Filled 2020-05-19: qty 2

## 2020-05-19 MED ORDER — ALLOPURINOL 100 MG PO TABS
100.0000 mg | ORAL_TABLET | Freq: Two times a day (BID) | ORAL | Status: DC
  Administered 2020-05-19 – 2020-05-20 (×3): 100 mg via ORAL
  Filled 2020-05-19 (×3): qty 1

## 2020-05-19 MED ORDER — HEPARIN SODIUM (PORCINE) 5000 UNIT/ML IJ SOLN
5000.0000 [IU] | Freq: Three times a day (TID) | INTRAMUSCULAR | Status: DC
  Administered 2020-05-19 – 2020-05-20 (×3): 5000 [IU] via SUBCUTANEOUS
  Filled 2020-05-19 (×3): qty 1

## 2020-05-19 MED ORDER — ONDANSETRON HCL 4 MG/2ML IJ SOLN
INTRAMUSCULAR | Status: AC
  Filled 2020-05-19: qty 2

## 2020-05-19 MED ORDER — ACETAMINOPHEN 650 MG RE SUPP: 650.0000 mg | Freq: Four times a day (QID) | RECTAL | Status: DC | PRN

## 2020-05-19 MED ORDER — ONDANSETRON HCL 4 MG/2ML IJ SOLN: 4.0000 mg | Freq: Once | INTRAMUSCULAR | Status: DC | PRN

## 2020-05-19 MED ORDER — ATORVASTATIN CALCIUM 20 MG PO TABS
10.0000 mg | ORAL_TABLET | Freq: Every evening | ORAL | Status: DC
  Administered 2020-05-19: 10 mg via ORAL
  Filled 2020-05-19: qty 1

## 2020-05-19 MED ORDER — GABAPENTIN 300 MG PO CAPS
300.0000 mg | ORAL_CAPSULE | Freq: Every day | ORAL | Status: DC
  Administered 2020-05-19: 300 mg via ORAL
  Filled 2020-05-19: qty 1

## 2020-05-19 SURGICAL SUPPLY — 25 items
BAG DRAIN CYSTO-URO LG1000N (MISCELLANEOUS) ×3 IMPLANT
BRUSH SCRUB EZ 1% IODOPHOR (MISCELLANEOUS) IMPLANT
CATH FOLEY 2WAY SIL 20X30 (CATHETERS) ×3 IMPLANT
CATH URETL 5X70 OPEN END (CATHETERS) ×3 IMPLANT
CONRAY 43 FOR UROLOGY 50M (MISCELLANEOUS) ×6 IMPLANT
GLIDEWIRE STR 0.035 150CM 3CM (WIRE) ×3 IMPLANT
GLOVE BIOGEL PI IND STRL 7.5 (GLOVE) ×2 IMPLANT
GLOVE BIOGEL PI INDICATOR 7.5 (GLOVE) ×1
GOWN STRL REUS W/ TWL LRG LVL3 (GOWN DISPOSABLE) ×2 IMPLANT
GOWN STRL REUS W/ TWL XL LVL3 (GOWN DISPOSABLE) ×2 IMPLANT
GOWN STRL REUS W/TWL LRG LVL3 (GOWN DISPOSABLE) ×1
GOWN STRL REUS W/TWL XL LVL3 (GOWN DISPOSABLE) ×1
GUIDEWIRE STR DUAL SENSOR (WIRE) ×3 IMPLANT
GUIDEWIRE SUPER STIFF (WIRE) IMPLANT
KIT TURNOVER CYSTO (KITS) ×3 IMPLANT
PACK CYSTO AR (MISCELLANEOUS) ×3 IMPLANT
SET CYSTO W/LG BORE CLAMP LF (SET/KITS/TRAYS/PACK) ×3 IMPLANT
SOL .9 NS 3000ML IRR  AL (IV SOLUTION) ×1
SOL .9 NS 3000ML IRR UROMATIC (IV SOLUTION) ×2 IMPLANT
STENT PERCUFLEX 4.8FRX28 (STENTS) ×3 IMPLANT
STENT URET 6FRX24 CONTOUR (STENTS) IMPLANT
STENT URET 6FRX26 CONTOUR (STENTS) IMPLANT
SURGILUBE 2OZ TUBE FLIPTOP (MISCELLANEOUS) ×3 IMPLANT
SYRINGE IRR TOOMEY STRL 70CC (SYRINGE) IMPLANT
WATER STERILE IRR 1000ML POUR (IV SOLUTION) ×3 IMPLANT

## 2020-05-19 NOTE — ED Notes (Signed)
Belongings sent w son. Includes phone, wallet, silver watch, glasses, khaki pants, Haeven Nickle belt, underwear, Lameeka Schleifer shoes and one pair black socks.

## 2020-05-19 NOTE — Op Note (Addendum)
Date of procedure: 05/19/20  Preoperative diagnosis:  1. Left distal ureteral stone 2. Acute kidney injury 3. UTI   Postoperative diagnosis:  1. Same   Procedure: 1. Cystoscopy, left retrograde pyelogram with intraoperative interpretation, left ureteral stent placement  Surgeon: Legrand Rams, MD  Anesthesia: General  Complications: None  Intraoperative findings:  1. Normal cystoscopy 2. Uncomplicated left ureteral stent placement  EBL: Minimal  Specimens: None  Drains: Left 4.29F x 28cm ureteral stent  Indication: Andrew Benton is a 74 y.o. patient with who presented with left-sided flank and abdominal pain for 48 hours and on lab work was found to have acute kidney injury with creatinine of 5 from baseline of 1.5, and CT showed a 6 mm left distal ureteral stone with upstream hydroureteronephrosis and multiple renal stones.  There is also a 1 cm right distal ureteral stone with moderate right hydronephrosis that has been chronic since at least 2014, with a completely atrophic right kidney.  After reviewing the management options for treatment, they elected to proceed with the above surgical procedure(s). We have discussed the potential benefits and risks of the procedure, side effects of the proposed treatment, the likelihood of the patient achieving the goals of the procedure, and any potential problems that might occur during the procedure or recuperation. Informed consent has been obtained.  Description of procedure:  The patient was taken to the operating room and general anesthesia was induced. SCDs were placed for DVT prophylaxis. The patient was placed in the dorsal lithotomy position, prepped and draped in the usual sterile fashion, and preoperative antibiotics(ceftriaxone) were administered. A preoperative time-out was performed.   A 21 French rigid cystoscope was used to intubate the urethra and a normal-appearing urethra was followed proximally into the bladder.  The  prostate was small.  Thorough cystoscopy showed no suspicious bladder lesions, and the ureteral orifices were orthotopic bilaterally.  A 5 French access catheter was used to perform a retrograde pyelogram on the left side which showed complete obstruction in the left distal ureter consistent with his known stone, and no contrast passed alongside up into the kidney.  I attempted to advance a sensor wire alongside the stone with the aid of the access catheter, however continue to meet resistance.  I then changed over to a Glidewire and ultimately was able to pass this alongside the stone up into the upper pole of the kidney.  The access catheter advanced over the wire with moderate difficulty up into the kidney, and the wire was removed.  There was a brisk hydronephrotic drip of clear urine.  Contrast was injected and opacified a dilated collecting system.  A sensor wire was replaced and the access catheter removed.  A 4.8 French by 28 centimeters ureteral stent was uneventfully placed on the left side with an excellent curl in the upper pole, as well as under direct vision the bladder.  There was brisk drainage through the side ports of the stent.  A 20F Foley was placed to maximize drainage in the setting of his AKI and possible infection.  10 cc were placed in the balloon.  Urine was clear.  Disposition: Stable to PACU  Plan: Continue antibiotics, follow cultures Maintain Foley until renal function improved Monitor for postobstructive diuresis We will arrange outpatient ureteroscopy, laser lithotripsy, and stent exchange in ~2 weeks  Legrand Rams, MD

## 2020-05-19 NOTE — ED Provider Notes (Signed)
[facility] Emergency Department Provider Note  ____________________________________________   First MD Initiated Contact with Patient 05/18/20 2357     (approximate)  I have reviewed the triage vital signs and the nursing notes.   HISTORY  Chief Complaint Abdominal Pain and Flank Pain    HPI Andrew Benton is a 74 y.o. male he has a history of  chronic kidney disease stage III and sees Dr. Wynelle Link and a history of obstructive kidney stones on the right side though he has never had urological procedures in the past.  He presents for evaluation of about 1 to 2 days of sharp pain in the left flank and left side of his abdomen.  It feels like prior kidney stones although he has not had them on the left before.  He has had episodes of nausea and vomiting.  Currently his pain is well controlled and he only has occasional pain but he has had significantly decreased urine output over the last 24+ hours.  He went to his primary care doctor earlier in the day yesterday (Thursday) and had a CT scan and lab work performed.  He received a call that the CT scan showed an obstructive stone and that his kidneys were not working as well as usual and he was told to go to the emergency department.  Due to overwhelming patient volume in the emergency department in hospital in general, the patient waited for more than 6 hours in the lobby.  Initially blood work was not performed because he told them that blood work to been done earlier in the day, but for some reason the lab results are not showing up in [facility] nor in care everywhere.  However his CT results are available.  The patient denies fever/chills, chest pain, shortness of breath, current nausea or vomiting even though he has had some recently, and dysuria.  He has had decreased urinary output and pain intermittently in the left flank and left lower abdomen.  He is fully vaccinated for COVID-19.  The symptoms have been severe and  gradual in onset over the last 1 to 2 days although the initial pain was acute in onset.     Past Medical History:  Diagnosis Date  . Kidney stones      Past Surgical History:  Procedure Laterality Date  . TONSILLECTOMY      Prior to Admission medications   Not on File    Allergies Patient has no known allergies.  No family history on file.  Social History Social History   Tobacco Use  . Smoking status: Never Smoker  . Smokeless tobacco: Never Used  Substance Use Topics  . Alcohol use: Not Currently  . Drug use: Not on file    Review of Systems Constitutional: No fever/chills Eyes: No visual changes. ENT: No sore throat. Cardiovascular: Denies chest pain. Respiratory: Denies shortness of breath. Gastrointestinal: Left-sided flank and abdominal pain associated with nausea and vomiting, now both improved.  No diarrhea.  No constipation. Genitourinary: Decreased urinary output.  Negative for dysuria. Musculoskeletal: Negative for neck pain.  Negative for back pain. Integumentary: Negative for rash. Neurological: Negative for headaches, focal weakness or numbness.   ____________________________________________   PHYSICAL EXAM:  VITAL SIGNS: ED Triage Vitals  Enc Vitals Group     BP 05/18/20 1754 (!) 181/89     Pulse Rate 05/18/20 1754 84     Resp 05/18/20 1754 16     Temp 05/18/20 1754 99.2 F (37.3 C)  Temp Source 05/18/20 1754 Oral     SpO2 05/18/20 1754 99 %     Weight 05/18/20 1755 93.9 kg (207 lb)     Height 05/18/20 1755 1.778 m (5\' 10" )     Head Circumference --      Peak Flow --      Pain Score 05/18/20 1755 2     Pain Loc --      Pain Edu? --      Excl. in GC? --     Constitutional: Alert and oriented.  Eyes: Conjunctivae are normal.  Head: Atraumatic. Nose: No congestion/rhinnorhea. Mouth/Throat: Patient is wearing a mask. Neck: No stridor.  No meningeal signs.   Cardiovascular: Normal rate, regular rhythm. Good peripheral  circulation. Grossly normal heart sounds. Respiratory: Normal respiratory effort.  No retractions. Gastrointestinal: Soft and nontender. No distention.  Musculoskeletal: No lower extremity tenderness nor edema. No gross deformities of extremities. Neurologic:  Normal speech and language. No gross focal neurologic deficits are appreciated.  Skin:  Skin is warm, dry and intact. Psychiatric: Mood and affect are normal. Speech and behavior are normal.  ____________________________________________   LABS (all labs ordered are listed, but only abnormal results are displayed)  Labs Reviewed  URINALYSIS, COMPLETE (UACMP) WITH MICROSCOPIC - Abnormal; Notable for the following components:      Result Value   Color, Urine YELLOW (*)    APPearance CLOUDY (*)    Glucose, UA 50 (*)    Hgb urine dipstick LARGE (*)    Protein, ur >=300 (*)    Leukocytes,Ua LARGE (*)    RBC / HPF >50 (*)    WBC, UA >50 (*)    All other components within normal limits  CBC - Abnormal; Notable for the following components:   WBC 20.3 (*)    All other components within normal limits  BASIC METABOLIC PANEL - Abnormal; Notable for the following components:   Potassium 5.2 (*)    Glucose, Bld 115 (*)    BUN 56 (*)    Creatinine, Ser 5.88 (*)    GFR calc non Af Amer 9 (*)    GFR calc Af Amer 10 (*)    All other components within normal limits  HEPATIC FUNCTION PANEL - Abnormal; Notable for the following components:   AST 48 (*)    Total Bilirubin 2.8 (*)    Bilirubin, Direct 0.4 (*)    Indirect Bilirubin 2.4 (*)    All other components within normal limits  SARS CORONAVIRUS 2 BY RT PCR (HOSPITAL ORDER, PERFORMED IN [facility])  URINE CULTURE  LACTIC ACID, PLASMA  PROCALCITONIN  LIPASE, BLOOD  LACTIC ACID, PLASMA  BASIC METABOLIC PANEL  CBC   ____________________________________________  EKG  No indication for emergent EKG ____________________________________________  RADIOLOGY 05/20/20, personally viewed and evaluated these images (plain radiographs) as part of my medical decision making, as well as reviewing the written report by the radiologist.  ED MD interpretation: CT scan performed as an outpatient demonstrates an atrophic right kidney and moderate to severe left hydronephrosis with an obstructive ureteral stone in the distal left ureter.  Official radiology report(s): CT ABDOMEN PELVIS WO CONTRAST  Result Date: 05/18/2020 CLINICAL DATA:  Onset left flank pain yesterday. EXAM: CT ABDOMEN AND PELVIS WITHOUT CONTRAST TECHNIQUE: Multidetector CT imaging of the abdomen and pelvis was performed following the standard protocol without IV contrast. COMPARISON:  Renal ultrasound 07/12/2013. FINDINGS: Lower chest: Lung bases clear. No pleural or pericardial effusion.  Heart size is upper normal. Hepatobiliary: No focal liver abnormality is seen. No gallstones, gallbladder wall thickening, or biliary dilatation. Pancreas: Unremarkable. No pancreatic ductal dilatation or surrounding inflammatory changes. Spleen: Normal in size without focal abnormality. Adrenals/Urinary Tract: The adrenal glands appear normal. The right kidney is severely atrophic and there is severe right hydroureteronephrosis. Distal right ureteral stones measuring up to 1 cm in diameter are seen. There is also 0.6 cm nonobstructing stone in the right kidney. Moderate to moderately severe left hydronephrosis with extensive stranding about the left kidney and ureter is due to a 0.6 cm stone in the distal left ureter at the level of the left hip. Multiple nonobstructing left renal stones measure up to 1 cm in diameter. The urinary bladder is completely decompressed. No stone is seen within the bladder. Stomach/Bowel: Stomach is within normal limits. Appendix appears normal. No evidence of bowel wall thickening, distention, or inflammatory changes. Diverticulosis without diverticulitis is most notable in the sigmoid colon.  Vascular/Lymphatic: Aortic atherosclerosis. No enlarged abdominal or pelvic lymph nodes. Reproductive: Prostate is unremarkable. Other: Tiny fat containing umbilical hernia. Musculoskeletal: No acute or focal abnormality. Convex left lumbar scoliosis and multilevel degenerative change noted. IMPRESSION: Moderate to moderately severe left hydronephrosis due to a 0.6 cm left ureteral stone at the level of the left hip. The patient has multiple nonobstructing left renal stones measuring up to 1 cm. Severe atrophy of the right kidney is likely due to chronic obstruction due to 1 or more stones in the distal right ureter measuring up to 1 cm in diameter. Diverticulosis without diverticulitis. Aortic Atherosclerosis (ICD10-I70.0). These results will be called to the ordering clinician or representative by the Radiologist Assistant, and communication documented in the PACS or [employer]. Electronically Signed   By: Drusilla Kanner M.D.   On: 05/18/2020 12:53    ____________________________________________   PROCEDURES   Procedure(s) performed (including Critical Care):  .Critical Care Performed by: Loleta Rose, MD Authorized by: Loleta Rose, MD   Critical care provider statement:    Critical care time (minutes):  30   Critical care time was exclusive of:  Separately billable procedures and treating other patients   Critical care was necessary to treat or prevent imminent or life-threatening deterioration of the following conditions:  Renal failure   Critical care was time spent personally by me on the following activities:  Development of treatment plan with patient or surrogate, discussions with consultants, evaluation of patient's response to treatment, examination of patient, obtaining history from patient or surrogate, ordering and performing treatments and interventions, ordering and review of laboratory studies, ordering and review of radiographic studies, pulse oximetry, re-evaluation  of patient's condition and review of old charts     ____________________________________________   INITIAL IMPRESSION / MDM / ASSESSMENT AND PLAN / ED COURSE  As part of my medical decision making, I reviewed the following data within the electronic MEDICAL RECORD NUMBER History obtained from family, Nursing notes reviewed and incorporated, Labs reviewed , Old chart reviewed, Discussed with admitting physician (Dr. Arville Care), A consult was requested and obtained from this/these consultant(s) Urology and Notes from prior ED visits   Differential diagnosis includes, but is not limited to, obstructive uropathy, acute renal failure, infected ureteral stone.  After initially not obtaining new lab work since the patient had lab work obtained earlier in the day, ED staff decided to go ahead and repeat the lab work since results were not visible in the computer system.  Repeat lab work obtained [employer]  demonstrates a creatinine of 5.88 highly concerning for acute renal failure.  His outpatient CT scan demonstrates obstructive uropathy on the left with a nonfunctional right kidney.  He has a leukocytosis of about 20 although he has no infectious signs or symptoms.  His potassium is only slightly elevated at 5.2.  Bilirubin is slightly elevated as well but this may be unrelated.  Lactic acid and procalcitonin are both normal which is reassuring.  I believe the patient will need emergent urological surgery (ureteral stent) given his renal failure in the setting of only one functional kidney.  I will contact urology.  I have updated the patient in order 1 L of crystalloid IV fluids to begin rehydration.       Clinical Course as of May 19 354  Fri May 19, 2020  0030 Paging Dr. Richardo Hanks with urology to discuss case   [CF]  0040 Discussed case with Dr. Richardo Hanks.  He agrees this qualifies as an emergency and will contact the OR to take him to the operating room for an emergent stent.  He requested ceftriaxone which  I have ordered (ceftriaxone 1 g IV) and agrees with the plan for and out catheterization for urinalysis and culture.  He requested hospitalist admission and I will consult hospitalist now for admission and care after the surgical procedure.   [CF]  0048 I verified that the patient has not had any food since about noon (more than 12 hours ago) and last had water 6 to 7 hours ago and it was only a small amount.   [CF]  0124 Discussed case by phone with Dr. Arville Care who will admit.  Dr. Richardo Hanks told me that the patient will go to the OR after the COVID swab results.    [CF]    Clinical Course User Index [CF] Loleta Rose, MD     ____________________________________________  FINAL CLINICAL IMPRESSION(S) / ED DIAGNOSES  Final diagnoses:  Acute renal failure, unspecified acute renal failure type (HCC)  Leukocytosis, unspecified type  Acute unilateral obstructive uropathy     MEDICATIONS GIVEN DURING THIS VISIT:  Medications  0.9 %  sodium chloride infusion (has no administration in time range)  acetaminophen (TYLENOL) tablet 650 mg ( Oral MAR Hold 05/19/20 0240)    Or  acetaminophen (TYLENOL) suppository 650 mg ( Rectal MAR Hold 05/19/20 0240)  morphine 2 MG/ML injection 1 mg ( Intravenous MAR Hold 05/19/20 0240)  traZODone (DESYREL) tablet 25 mg ( Oral MAR Hold 05/19/20 0240)  ondansetron (ZOFRAN) tablet 4 mg ( Oral MAR Hold 05/19/20 0240)    Or  ondansetron (ZOFRAN) injection 4 mg ( Intravenous MAR Hold 05/19/20 0240)  cefTRIAXone (ROCEPHIN) 2 g in sodium chloride 0.9 % 100 mL IVPB ( Intravenous Automatically Held 05/27/20 0200)  heparin injection 5,000 Units ( Subcutaneous Automatically Held 05/27/20 2200)  labetalol (NORMODYNE) injection 20 mg ( Intravenous MAR Hold 05/19/20 0240)  fentaNYL (SUBLIMAZE) injection 25 mcg (has no administration in time range)  oxyCODONE (Oxy IR/ROXICODONE) immediate release tablet 5 mg (has no administration in time range)    Or  oxyCODONE (ROXICODONE) 5  MG/5ML solution 5 mg (has no administration in time range)  ondansetron (ZOFRAN) injection 4 mg (has no administration in time range)  lactated ringers bolus 1,000 mL (0 mLs Intravenous Stopped 05/19/20 0207)     ED Discharge Orders    None      *Please note:  Andrew Benton was evaluated in Emergency Department on 05/19/2020 for the symptoms described in the  history of present illness. He was evaluated in the context of the global COVID-19 pandemic, which necessitated consideration that the patient might be at risk for infection with the SARS-CoV-2 virus that causes COVID-19. Institutional protocols and algorithms that pertain to the evaluation of patients at risk for COVID-19 are in a state of rapid change based on information released by regulatory bodies including the CDC and federal and state organizations. These policies and algorithms were followed during the patient's care in the ED.  Some ED evaluations and interventions may be delayed as a result of limited staffing during and after the pandemic.*  Note:  This document was prepared using Dragon voice recognition software and may include unintentional dictation errors.   Loleta Rose, MD 05/19/20 [id number]

## 2020-05-19 NOTE — Transfer of Care (Signed)
Immediate Anesthesia Transfer of Care Note  Patient: Andrew Benton  Procedure(s) Performed: CYSTOSCOPY WITH STENT PLACEMENT (Left ) CYSTOSCOPY WITH RETROGRADE PYELOGRAM  Patient Location: PACU  Anesthesia Type:General  Level of Consciousness: sedated  Airway & Oxygen Therapy: Patient Spontanous Breathing and Patient connected to face mask oxygen  Post-op Assessment: Report given to RN and Post -op Vital signs reviewed and stable  Post vital signs: Reviewed  Last Vitals:  Vitals Value Taken Time  BP 116/73 05/19/20 0316  Temp 36.7 C 05/19/20 0313  Pulse 59 05/19/20 0316  Resp 10 05/19/20 0316  SpO2 100 % 05/19/20 0316  Vitals shown include unvalidated device data.  Last Pain:  Vitals:   05/19/20 0007  TempSrc:   PainSc: 2          Complications: No complications documented.

## 2020-05-19 NOTE — Progress Notes (Signed)
No charge progress note.  Andrew Benton  is a 74 y.o. Caucasian male with a known history of hypertension, right-sided nephrolithiasis and dyslipidemia, who presented to the emergency room with acute onset of left-sided flank pain radiating to his left groin, with inability to urinate as well as associated nausea and vomiting.  He graded his pain 9-10/10 in severity yesterday and today was 2- 3/10.  He was seen by and prescribed hydrocodone that helped with his pain.  He denies any fever or chills.  No chest pain or dyspnea or cough or wheezing.  Upon presentation to the emergency room, blood pressure was 181/89, temperature 99.2 and heart rate was normal Neurontin 110 with otherwise normal vital signs. Labs revealed potassium 5.2 and a BUN of 56 with creatinine 5.88 with previously normal renal functions.  AST was 48 and total bili was 2.8 with indirect bili of 2.4.  Lactic acid was 1.4.  CBC showed leukocytosis 20.3.  Urinalysis was strongly positive for UTI and showed more than 300 protein.  CT abdomen with severe left hydronephrosis due to a 0.6 cm obstructing left distal ureteral stone.  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  Dr. Richardo Hanks from urology took him to the OR for cystoscopy and a stent placement.  Patient tolerated the procedure well.  Creatinine started trending down.  Still above 4.  His pain was much improved when seen today.  Did become tachycardic while using the bathroom.  EKG with SVT.  Heart rate improves with resting.  Apparently patient was on propranolol at home which was never restarted as there was no med rec.home meds were restarted. -Continue with current management. -Follow-up urine cultures.

## 2020-05-19 NOTE — Progress Notes (Signed)
   05/19/20 1205  Assess: MEWS Score  Temp 99.2 F (37.3 C)  BP 109/72  Pulse Rate (!) 169  Resp 20  SpO2 98 %  O2 Device Room Air  Assess: MEWS Score  MEWS Temp 0  MEWS Systolic 0  MEWS Pulse 3  MEWS RR 0  MEWS LOC 0  MEWS Score 3  MEWS Score Color Yellow  Assess: if the MEWS score is Yellow or Red  Were vital signs taken at a resting state? Yes  Focused Assessment Documented focused assessment  Early Detection of Sepsis Score *See Row Information* Low  MEWS guidelines implemented *See Row Information* Yes  Treat  MEWS Interventions Escalated (See documentation below)  Take Vital Signs  Increase Vital Sign Frequency  Yellow: Q 2hr X 2 then Q 4hr X 2, if remains yellow, continue Q 4hrs  Escalate  MEWS: Escalate Yellow: discuss with charge nurse/RN and consider discussing with provider and RRT  Notify: Charge Nurse/RN  Name of Charge Nurse/RN Notified Irving Copas RN  Date Charge Nurse/RN Notified 05/19/20  Time Charge Nurse/RN Notified 1235  Notify: Provider  Provider Name/Title  Arnetha Courser)  Date Provider Notified 05/19/20  Time Provider Notified 1235  Notification Type Face-to-face  Notification Reason Change in status  Response See new orders  Date of Provider Response 05/19/20  Time of Provider Response 1235  Document  Patient Outcome Other (Comment) (Continue to monitor)  Progress note created (see row info) Yes

## 2020-05-19 NOTE — Op Note (Signed)
Urology Consult   I have been asked to see the patient by Dr. Karma Greaser, for evaluation and management of left distal ureteral stone and AKI.  Chief Complaint: Left flank pain  HPI:  Andrew Benton is a 74 y.o. year old male with CKD and baseline creatinine 1.5, EGFR 45, who presents with 2 days of left-sided abdominal and flank pain.  A CT was ordered by his PCP today and showed a 6 mm left distal ureteral stone with moderate upstream hydronephrosis and multiple renal stones, as well as a 1 cm right distal ureteral stone with severe hydronephrosis and a completely atrophic right kidney.  Lab work was notable for leukocytosis to 20.3k, AKI with creatinine of 5.88 from his baseline of 1.5, potassium of 5.2, and urinalysis notable for greater than 50 RBCs, greater than 50 WBCs, WBC clumps, no bacteria, nitrite negative, large leukocytes.  In the ED he was hypertensive with low-grade temperature of 99.2.  He denies any right sided flank or abdominal pain. He was told many years ago that his right kidney was blocked by a large stone and observation alone was recommended based on his right renal atrophy.  PMH: Past Medical History:  Diagnosis Date  . Kidney stones     Surgical History: Past Surgical History:  Procedure Laterality Date  . TONSILLECTOMY       Allergies: No Known Allergies  Family History: No family history on file.  Social History:  reports that he has never smoked. He has never used smokeless tobacco. He reports previous alcohol use. No history on file for drug use.  ROS: Negative aside from those stated in the HPI.  Physical Exam: BP (!) 168/96   Pulse 89   Temp 99.2 F (37.3 C) (Oral)   Resp 16   Ht _0  (1.778 m)   Wt 93.9 kg   SpO2 98%   BMI 29.70 kg/m    Constitutional:  Alert and oriented, No acute distress. Cardiovascular: RRR Respiratory: Clear to auscultation bilaterally GI: Abdomen is soft, nontender, nondistended, no abdominal masses GU: L  CVA tenderness  Laboratory Data: Reviewed, see HPI  Pertinent Imaging: I have personally reviewed the CT showing a 6 mm left distal ureteral stone with moderate upstream hydroureteronephrosis and multiple renal stones, as well as a 1 cm right distal ureteral stone and severe right-sided hydroureteronephrosis with almost complete right renal atrophy and no significant remaining parenchyma.  Assessment & Plan:   74 yo M with 48 hours of left abdominal pain and flank pain, low UOP, and 64m left distal ureteral stone and multiple renal stones in essentially solitary left kidney with AKI and UA concerning for infection. 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  We discussed the need for drainage in the setting of an essentially solitary left kidney with AKI and possible infection.  A ureteral stent is a small plastic tube that is placed cystoscopically with one end in the kidney and the other end in the bladder that allows the infection from the kidney to drain, and relieves pain from the obstructing stone.  We discussed the risks at length including bleeding, infection, sepsis, death, ureteral injury, and stent related symptoms including urgency/frequency/dysuria/flank pain/gross hematuria.  There is a low, but not 0, risk of inability to pass the ureteral stent alongside the stone from below which would require percutaneous nephrostomy tube by interventional radiology.  Finally,  we discussed possible prolonged hospitalization and recovery, possible temporary Foley catheter placement, and 10 to 14-day course of antibiotics.  We reviewed the need for a follow-up procedure for definitive management of their stone when the infection has been treated in 2 to 3 weeks with either ureteroscopy/laser lithotripsy.  Regarding his right-sided 1 cm distal ureteral stone and severe hydroureteronephrosis,  there is essentially no remaining right renal parenchyma, and I do not feel that right ureteral stent placement or treatment of his right ureteral stone would result in any meaningful right renal recovery.  -Recommend broad-spectrum antibiotics and admission -Left ureteral stent placement tonight -We will need follow-up ureteroscopy in ~2 weeks for definitive laser lithotripsy and removal of left ureteral and renal stones  Billey Co, MD  Total time spent on the floor was 80 minutes, with greater than 50% spent in counseling and coordination of care with the patient regarding bilateral ureteral stones, right renal atrophy, left hydronephrosis, acute kidney injury, and possible UTI/urosepsis.  [facility] [address] [place], [place] [address] [phone]

## 2020-05-19 NOTE — H&P (View-Only) (Signed)
[facility] [facility]   PATIENT NAME: Andrew Benton    MR#:  [id number]  DATE OF BIRTH:  [birth year]  DATE OF ADMISSION:  05/18/2020  PRIMARY CARE PHYSICIAN: Gracelyn Nurse, MD   REQUESTING/REFERRING PHYSICIAN: Loleta Rose, MD  CHIEF COMPLAINT:   Chief Complaint  Patient presents with  . Abdominal Pain  . Flank Pain    HISTORY OF PRESENT ILLNESS:  Andrew Benton  is a 74 y.o. Caucasian male with a known history of hypertension, right-sided nephrolithiasis and dyslipidemia, who presented to the emergency room with acute onset of left-sided flank pain radiating to his left groin, with inability to urinate as well as associated nausea and vomiting.  He graded his pain 9-10/10 in severity yesterday and today was 2- 3/10.  He was seen by and prescribed hydrocodone that helped with his pain.  He denies any fever or chills.  No chest pain or dyspnea or cough or wheezing.  Upon presentation to the emergency room, blood pressure was 181/89, temperature 99.2 and heart rate was normal Neurontin 110 with otherwise normal vital signs. Labs revealed potassium 5.2 and a BUN of 56 with creatinine 5.88 with previously normal renal functions.  AST was 48 and total bili was 2.8 with indirect bili of 2.4.  Lactic acid was 1.4.  CBC showed leukocytosis 20.3.  Urinalysis was strongly positive for UTI and showed more than 300 protein.  The patient was given IV Rocephin and 1 L bolus of IV lactated Ringer.  Dr. Richardo Hanks was notified about the patient and the patient will undergo urgent cystoureteroscopy and left ureteral stent.  He will be admitted to a medically monitored bed for further evaluation and management.  PAST MEDICAL HISTORY:   Past Medical History:  Diagnosis Date  . Kidney stones   -Dyslipidemia -Hypertension  PAST SURGICAL HISTORY:   Past Surgical History:  Procedure Laterality Date  . TONSILLECTOMY      SOCIAL HISTORY:   Social History   Tobacco Use  . Smoking  status: Never Smoker  . Smokeless tobacco: Never Used  Substance Use Topics  . Alcohol use: Not Currently    FAMILY HISTORY:  His father died from heart disease.  DRUG ALLERGIES:  No Known Allergies  REVIEW OF SYSTEMS:   ROS As per history of present illness. All pertinent systems were reviewed above. Constitutional, HEENT, cardiovascular, respiratory, GI, GU, musculoskeletal, neuro, psychiatric, endocrine, integumentary and hematologic systems were reviewed and are otherwise negative/unremarkable except for positive findings mentioned above in the HPI.   MEDICATIONS AT HOME:   Prior to Admission medications   Not on File      VITAL SIGNS:  Blood pressure (!) 168/96, pulse 89, temperature 99.2 F (37.3 C), temperature source Oral, resp. rate 16, height 5\' 10"  (1.778 m), weight 93.9 kg, SpO2 98 %.  PHYSICAL EXAMINATION:  Physical Exam  GENERAL:  74 y.o.-year-old Caucasian male patient lying in the bed with no acute distress.  EYES: Pupils equal, round, reactive to light and accommodation. No scleral icterus. Extraocular muscles intact.  HEENT: Head atraumatic, normocephalic. Oropharynx and nasopharynx clear.  NECK:  Supple, no jugular venous distention. No thyroid enlargement, no tenderness.  LUNGS: Normal breath sounds bilaterally, no wheezing, rales,rhonchi or crepitation. No use of accessory muscles of respiration.  CARDIOVASCULAR: Regular rate and rhythm, S1, S2 normal. No murmurs, rubs, or gallops.  ABDOMEN: Soft, nondistended, with left lower quadrant and mild right CVA tenderness without rebound tenderness guarding or rigidity.  Bowel sounds present. No organomegaly or mass.  EXTREMITIES: No pedal edema, cyanosis, or clubbing.  NEUROLOGIC: Cranial nerves II through XII are intact. Muscle strength 5/5 in all extremities. Sensation intact. Gait not checked.  PSYCHIATRIC: The patient is alert and oriented x 3.  Normal affect and good eye contact. SKIN: No obvious rash,  lesion, or ulcer.   LABORATORY PANEL:   CBC Recent Labs  Lab 05/18/20 2326  WBC 20.3*  HGB 14.4  HCT 42.1  PLT 200   ------------------------------------------------------------------------------------------------------------------  Chemistries  Recent Labs  Lab 05/18/20 2326  NA 135  K 5.2*  CL 102  CO2 22  GLUCOSE 115*  BUN 56*  CREATININE 5.88*  CALCIUM 9.3  AST 48*  ALT 31  ALKPHOS 67  BILITOT 2.8*   ------------------------------------------------------------------------------------------------------------------  Cardiac Enzymes No results for input(s): TROPONINI in the last 168 hours. ------------------------------------------------------------------------------------------------------------------  RADIOLOGY:  CT ABDOMEN PELVIS WO CONTRAST  Result Date: 05/18/2020 CLINICAL DATA:  Onset left flank pain yesterday. EXAM: CT ABDOMEN AND PELVIS WITHOUT CONTRAST TECHNIQUE: Multidetector CT imaging of the abdomen and pelvis was performed following the standard protocol without IV contrast. COMPARISON:  Renal ultrasound 07/12/2013. FINDINGS: Lower chest: Lung bases clear. No pleural or pericardial effusion. Heart size is upper normal. Hepatobiliary: No focal liver abnormality is seen. No gallstones, gallbladder wall thickening, or biliary dilatation. Pancreas: Unremarkable. No pancreatic ductal dilatation or surrounding inflammatory changes. Spleen: Normal in size without focal abnormality. Adrenals/Urinary Tract: The adrenal glands appear normal. The right kidney is severely atrophic and there is severe right hydroureteronephrosis. Distal right ureteral stones measuring up to 1 cm in diameter are seen. There is also 0.6 cm nonobstructing stone in the right kidney. Moderate to moderately severe left hydronephrosis with extensive stranding about the left kidney and ureter is due to a 0.6 cm stone in the distal left ureter at the level of the left hip. Multiple nonobstructing  left renal stones measure up to 1 cm in diameter. The urinary bladder is completely decompressed. No stone is seen within the bladder. Stomach/Bowel: Stomach is within normal limits. Appendix appears normal. No evidence of bowel wall thickening, distention, or inflammatory changes. Diverticulosis without diverticulitis is most notable in the sigmoid colon. Vascular/Lymphatic: Aortic atherosclerosis. No enlarged abdominal or pelvic lymph nodes. Reproductive: Prostate is unremarkable. Other: Tiny fat containing umbilical hernia. Musculoskeletal: No acute or focal abnormality. Convex left lumbar scoliosis and multilevel degenerative change noted. IMPRESSION: Moderate to moderately severe left hydronephrosis due to a 0.6 cm left ureteral stone at the level of the left hip. The patient has multiple nonobstructing left renal stones measuring up to 1 cm. Severe atrophy of the right kidney is likely due to chronic obstruction due to 1 or more stones in the distal right ureter measuring up to 1 cm in diameter. Diverticulosis without diverticulitis. Aortic Atherosclerosis (ICD10-I70.0). These results will be called to the ordering clinician or representative by the Radiologist Assistant, and communication documented in the PACS or [facility] Dashboard. Electronically Signed   By: Thomas  Dalessio M.D.   On: 05/18/2020 12:53      IMPRESSION AND PLAN:   1.  Left obstructive uropathy secondary to left distal ureteral stone with subsequent acute kidney injury. -The patient will be admitted to the medical monitored bed. -He will be continued on IV antibiotic therapy with IV Rocephin. -He will be hydrated with IV normal saline and will follow BMP. -Pain management will be provided. -Dr. Sninsky was consulted and will perform an urgent cystoureteroscopy and left   ureteral stent. -We will follow lactic acid level.  2.  UTI with subsequent mild sepsis without severe sepsis or septic shock.  This is manifested by leukocytosis  and tachycardia. -This is like secondary to his urolithiasis. -He will be placed on IV Rocephin as mentioned above and will follow urine culture and sensitivity.  3.  Mild hyperkalemia. -Potassium will be monitored with hydration.  4.  Uncontrolled hypertension. -The patient will be continued on Inderal and placed on as needed IV labetalol. -We will hold off Zestril given his acute kidney injury.  5. Anxiety. -We will continue Klonopin.  6.  Gout.  -We will continue allopurinol.  7.  Dyslipidemia. -We will continue statin therapy.  All the records are reviewed and case discussed with ED provider. The plan of care was discussed in details with the patient (and family). I answered all questions. The patient agreed to proceed with the above mentioned plan. Further management will depend upon hospital course.   CODE STATUS: Full code  Status is: Inpatient  Remains inpatient appropriate because:Ongoing active pain requiring inpatient pain management, Ongoing diagnostic testing needed not appropriate for outpatient work up, Unsafe d/c plan, IV treatments appropriate due to intensity of illness or inability to take PO and Inpatient level of care appropriate due to severity of illness   Dispo: The patient is from: Home              Anticipated d/c is to: Home              Anticipated d/c date is: 2 days              Patient currently is not medically stable to d/c.   TOTAL TIME TAKING CARE OF THIS PATIENT: 55 minutes.    Hannah Beat M.D on 05/19/2020 at 2:00 AM  [facility]   From 7 PM-7 AM, contact night-coverage [email or web address]  CC: Primary care physician; Gracelyn Nurse, MD   Note: This dictation was prepared with Dragon dictation along with smaller phrase technology. Any transcriptional typo errors that result from this process are unintentional.

## 2020-05-19 NOTE — Anesthesia Preprocedure Evaluation (Addendum)
Anesthesia Evaluation  Patient identified by MRN, date of birth, ID band Patient awake    Reviewed: Allergy & Precautions, H&P , NPO status , Patient's Chart, lab work & pertinent test results  Airway Mallampati: III  TM Distance: >3 FB     Dental  (+) Chipped   Pulmonary neg pulmonary ROS,    breath sounds clear to auscultation       Cardiovascular negative cardio ROS   Rhythm:regular Rate:Tachycardia     Neuro/Psych negative neurological ROS  negative psych ROS   GI/Hepatic negative GI ROS, Neg liver ROS,   Endo/Other  negative endocrine ROS  Renal/GU ARFRenal diseaseAtrophic right kidney w/ hydronephrosis, likely from chronic obstruction Left kidney with obstructing stone and hydronephrosis     Musculoskeletal   Abdominal   Peds  Hematology negative hematology ROS (+)   Anesthesia Other Findings Past Medical History: No date: Kidney stones  Past Surgical History: No date: TONSILLECTOMY  BMI    Body Mass Index: 29.70 kg/m      Reproductive/Obstetrics negative OB ROS                            Anesthesia Physical Anesthesia Plan  ASA: III and emergent  Anesthesia Plan: General   Post-op Pain Management:    Induction:   PONV Risk Score and Plan: Ondansetron, Dexamethasone and Treatment may vary due to age or medical condition  Airway Management Planned:   Additional Equipment:   Intra-op Plan:   Post-operative Plan:   Informed Consent: I have reviewed the patients History and Physical, chart, labs and discussed the procedure including the risks, benefits and alternatives for the proposed anesthesia with the patient or authorized representative who has indicated his/her understanding and acceptance.     Dental Advisory Given  Plan Discussed with: Anesthesiologist, CRNA and Surgeon  Anesthesia Plan Comments:        Anesthesia Quick Evaluation

## 2020-05-19 NOTE — H&P (Addendum)
[place] [facility]   PATIENT NAME: Andrew Benton    MR#:  [id number]  DATE OF BIRTH:  [birth year]  DATE OF ADMISSION:  05/18/2020  PRIMARY CARE PHYSICIAN: Gracelyn Nurse, MD   REQUESTING/REFERRING PHYSICIAN: Loleta Rose, MD  CHIEF COMPLAINT:   Chief Complaint  Patient presents with  . Abdominal Pain  . Flank Pain    HISTORY OF PRESENT ILLNESS:  Andrew Benton  is a 74 y.o. Caucasian male with a known history of hypertension, right-sided nephrolithiasis and dyslipidemia, who presented to the emergency room with acute onset of left-sided flank pain radiating to his left groin, with inability to urinate as well as associated nausea and vomiting.  He graded his pain 9-10/10 in severity yesterday and today was 2- 3/10.  He was seen by and prescribed hydrocodone that helped with his pain.  He denies any fever or chills.  No chest pain or dyspnea or cough or wheezing.  Upon presentation to the emergency room, blood pressure was 181/89, temperature 99.2 and heart rate was normal Neurontin 110 with otherwise normal vital signs. Labs revealed potassium 5.2 and a BUN of 56 with creatinine 5.88 with previously normal renal functions.  AST was 48 and total bili was 2.8 with indirect bili of 2.4.  Lactic acid was 1.4.  CBC showed leukocytosis 20.3.  Urinalysis was strongly positive for UTI and showed more than 300 protein.  The patient was given IV Rocephin and 1 L bolus of IV lactated Ringer.  Dr. Richardo Hanks was notified about the patient and the patient will undergo urgent cystoureteroscopy and left ureteral stent.  He will be admitted to a medically monitored bed for further evaluation and management.  PAST MEDICAL HISTORY:   Past Medical History:  Diagnosis Date  . Kidney stones   -Dyslipidemia -Hypertension  PAST SURGICAL HISTORY:   Past Surgical History:  Procedure Laterality Date  . TONSILLECTOMY      SOCIAL HISTORY:   Social History   Tobacco Use  . Smoking  status: Never Smoker  . Smokeless tobacco: Never Used  Substance Use Topics  . Alcohol use: Not Currently    FAMILY HISTORY:  His father died from heart disease.  DRUG ALLERGIES:  No Known Allergies  REVIEW OF SYSTEMS:   ROS As per history of present illness. All pertinent systems were reviewed above. Constitutional, HEENT, cardiovascular, respiratory, GI, GU, musculoskeletal, neuro, psychiatric, endocrine, integumentary and hematologic systems were reviewed and are otherwise negative/unremarkable except for positive findings mentioned above in the HPI.   MEDICATIONS AT HOME:   Prior to Admission medications   Not on File      VITAL SIGNS:  Blood pressure (!) 168/96, pulse 89, temperature 99.2 F (37.3 C), temperature source Oral, resp. rate 16, height 5\' 10"  (1.778 m), weight 93.9 kg, SpO2 98 %.  PHYSICAL EXAMINATION:  Physical Exam  GENERAL:  74 y.o.-year-old Caucasian male patient lying in the bed with no acute distress.  EYES: Pupils equal, round, reactive to light and accommodation. No scleral icterus. Extraocular muscles intact.  HEENT: Head atraumatic, normocephalic. Oropharynx and nasopharynx clear.  NECK:  Supple, no jugular venous distention. No thyroid enlargement, no tenderness.  LUNGS: Normal breath sounds bilaterally, no wheezing, rales,rhonchi or crepitation. No use of accessory muscles of respiration.  CARDIOVASCULAR: Regular rate and rhythm, S1, S2 normal. No murmurs, rubs, or gallops.  ABDOMEN: Soft, nondistended, with left lower quadrant and mild right CVA tenderness without rebound tenderness guarding or rigidity.  Bowel sounds present. No organomegaly or mass.  EXTREMITIES: No pedal edema, cyanosis, or clubbing.  NEUROLOGIC: Cranial nerves II through XII are intact. Muscle strength 5/5 in all extremities. Sensation intact. Gait not checked.  PSYCHIATRIC: The patient is alert and oriented x 3.  Normal affect and good eye contact. SKIN: No obvious rash,  lesion, or ulcer.   LABORATORY PANEL:   CBC Recent Labs  Lab 05/18/20 2326  WBC 20.3*  HGB 14.4  HCT 42.1  PLT 200   ------------------------------------------------------------------------------------------------------------------  Chemistries  Recent Labs  Lab 05/18/20 2326  NA 135  K 5.2*  CL 102  CO2 22  GLUCOSE 115*  BUN 56*  CREATININE 5.88*  CALCIUM 9.3  AST 48*  ALT 31  ALKPHOS 67  BILITOT 2.8*   ------------------------------------------------------------------------------------------------------------------  Cardiac Enzymes No results for input(s): TROPONINI in the last 168 hours. ------------------------------------------------------------------------------------------------------------------  RADIOLOGY:  CT ABDOMEN PELVIS WO CONTRAST  Result Date: 05/18/2020 CLINICAL DATA:  Onset left flank pain yesterday. EXAM: CT ABDOMEN AND PELVIS WITHOUT CONTRAST TECHNIQUE: Multidetector CT imaging of the abdomen and pelvis was performed following the standard protocol without IV contrast. COMPARISON:  Renal ultrasound 07/12/2013. FINDINGS: Lower chest: Lung bases clear. No pleural or pericardial effusion. Heart size is upper normal. Hepatobiliary: No focal liver abnormality is seen. No gallstones, gallbladder wall thickening, or biliary dilatation. Pancreas: Unremarkable. No pancreatic ductal dilatation or surrounding inflammatory changes. Spleen: Normal in size without focal abnormality. Adrenals/Urinary Tract: The adrenal glands appear normal. The right kidney is severely atrophic and there is severe right hydroureteronephrosis. Distal right ureteral stones measuring up to 1 cm in diameter are seen. There is also 0.6 cm nonobstructing stone in the right kidney. Moderate to moderately severe left hydronephrosis with extensive stranding about the left kidney and ureter is due to a 0.6 cm stone in the distal left ureter at the level of the left hip. Multiple nonobstructing  left renal stones measure up to 1 cm in diameter. The urinary bladder is completely decompressed. No stone is seen within the bladder. Stomach/Bowel: Stomach is within normal limits. Appendix appears normal. No evidence of bowel wall thickening, distention, or inflammatory changes. Diverticulosis without diverticulitis is most notable in the sigmoid colon. Vascular/Lymphatic: Aortic atherosclerosis. No enlarged abdominal or pelvic lymph nodes. Reproductive: Prostate is unremarkable. Other: Tiny fat containing umbilical hernia. Musculoskeletal: No acute or focal abnormality. Convex left lumbar scoliosis and multilevel degenerative change noted. IMPRESSION: Moderate to moderately severe left hydronephrosis due to a 0.6 cm left ureteral stone at the level of the left hip. The patient has multiple nonobstructing left renal stones measuring up to 1 cm. Severe atrophy of the right kidney is likely due to chronic obstruction due to 1 or more stones in the distal right ureter measuring up to 1 cm in diameter. Diverticulosis without diverticulitis. Aortic Atherosclerosis (ICD10-I70.0). These results will be called to the ordering clinician or representative by the Radiologist Assistant, and communication documented in the PACS or [employer]. Electronically Signed   By: Drusilla Kanner M.D.   On: 05/18/2020 12:53      IMPRESSION AND PLAN:   1.  Left obstructive uropathy secondary to left distal ureteral stone with subsequent acute kidney injury. -The patient will be admitted to the medical monitored bed. -He will be continued on IV antibiotic therapy with IV Rocephin. -He will be hydrated with IV normal saline and will follow BMP. -Pain management will be provided. -Dr. Richardo Hanks was consulted and will perform an urgent cystoureteroscopy and left  ureteral stent. -We will follow lactic acid level.  2.  UTI with subsequent mild sepsis without severe sepsis or septic shock.  This is manifested by leukocytosis  and tachycardia. -This is like secondary to his urolithiasis. -He will be placed on IV Rocephin as mentioned above and will follow urine culture and sensitivity.  3.  Mild hyperkalemia. -Potassium will be monitored with hydration.  4.  Uncontrolled hypertension. -The patient will be continued on Inderal and placed on as needed IV labetalol. -We will hold off Zestril given his acute kidney injury.  5. Anxiety. -We will continue Klonopin.  6.  Gout.  -We will continue allopurinol.  7.  Dyslipidemia. -We will continue statin therapy.  All the records are reviewed and case discussed with ED provider. The plan of care was discussed in details with the patient (and family). I answered all questions. The patient agreed to proceed with the above mentioned plan. Further management will depend upon hospital course.   CODE STATUS: Full code  Status is: Inpatient  Remains inpatient appropriate because:Ongoing active pain requiring inpatient pain management, Ongoing diagnostic testing needed not appropriate for outpatient work up, Unsafe d/c plan, IV treatments appropriate due to intensity of illness or inability to take PO and Inpatient level of care appropriate due to severity of illness   Dispo: The patient is from: Home              Anticipated d/c is to: Home              Anticipated d/c date is: 2 days              Patient currently is not medically stable to d/c.   TOTAL TIME TAKING CARE OF THIS PATIENT: 55 minutes.    Hannah Beat M.D on 05/19/2020 at 2:00 AM  [facility]   From 7 PM-7 AM, contact night-coverage [email or web address]  CC: Primary care physician; Gracelyn Nurse, MD   Note: This dictation was prepared with Dragon dictation along with smaller phrase technology. Any transcriptional typo errors that result from this process are unintentional.

## 2020-05-19 NOTE — Anesthesia Procedure Notes (Signed)
Procedure Name: LMA Insertion Performed by: Piedad Standiford, CRNA Pre-anesthesia Checklist: Patient identified, Patient being monitored, Timeout performed, Emergency Drugs available and Suction available Patient Re-evaluated:Patient Re-evaluated prior to induction Oxygen Delivery Method: Circle system utilized Preoxygenation: Pre-oxygenation with 100% oxygen Induction Type: IV induction Ventilation: Mask ventilation without difficulty LMA: LMA inserted LMA Size: 4.0 Tube type: Oral Number of attempts: 1 Placement Confirmation: positive ETCO2 and breath sounds checked- equal and bilateral Tube secured with: Tape Dental Injury: Teeth and Oropharynx as per pre-operative assessment        

## 2020-05-20 DIAGNOSIS — D72829 Elevated white blood cell count, unspecified: Secondary | ICD-10-CM

## 2020-05-20 DIAGNOSIS — N179 Acute kidney failure, unspecified: Secondary | ICD-10-CM

## 2020-05-20 LAB — CBC
HCT: 36.5 % — ABNORMAL LOW (ref 39.0–52.0)
Hemoglobin: 12.5 g/dL — ABNORMAL LOW (ref 13.0–17.0)
MCH: 32.9 pg (ref 26.0–34.0)
MCHC: 34.2 g/dL (ref 30.0–36.0)
MCV: 96.1 fL (ref 80.0–100.0)
Platelets: 144 10*3/uL — ABNORMAL LOW (ref 150–400)
RBC: 3.8 MIL/uL — ABNORMAL LOW (ref 4.22–5.81)
RDW: 13.8 % (ref 11.5–15.5)
WBC: 9.1 10*3/uL (ref 4.0–10.5)
nRBC: 0 % (ref 0.0–0.2)

## 2020-05-20 LAB — URINE CULTURE
Culture: <10000 — AB
Special Requests: NORMAL

## 2020-05-20 LAB — BASIC METABOLIC PANEL
Anion gap: 9 (ref 5–15)
BUN: 40 mg/dL — ABNORMAL HIGH (ref 8–23)
CO2: 22 mmol/L (ref 22–32)
Calcium: 8.7 mg/dL — ABNORMAL LOW (ref 8.9–10.3)
Chloride: 106 mmol/L (ref 98–111)
Creatinine, Ser: 2.24 mg/dL — ABNORMAL HIGH (ref 0.61–1.24)
GFR calc Af Amer: 32 mL/min — ABNORMAL LOW (ref >60)
GFR calc non Af Amer: 28 mL/min — ABNORMAL LOW (ref >60)
Glucose, Bld: 135 mg/dL — ABNORMAL HIGH (ref 70–99)
Potassium: 4.2 mmol/L (ref 3.5–5.1)
Sodium: 137 mmol/L (ref 135–145)

## 2020-05-20 NOTE — Progress Notes (Signed)
Foley was disconitnued at this time. Pt is DTV between 1800-2000. After void patient will be discharge.

## 2020-05-20 NOTE — Plan of Care (Signed)
Pt w/ R kidney not functioning at baseline and L kidney w/stones, had stent and foley placed.  Having urine output. Not c/o pain.

## 2020-05-20 NOTE — Discharge Summary (Signed)
Physician Discharge Summary  Andrew Benton FIE:[id number] DOB: [birth year] DOA: 05/18/2020  PCP: Gracelyn Nurse, MD  Admit date: 05/18/2020 Discharge date: 05/20/2020  Admitted From: Home Disposition: Home   Recommendations for Outpatient Follow-up:  1. Follow up with PCP in 1-2 weeks 2. Please obtain BMP/CBC in one week 3. Please follow up on the following pending results:None  Home Health:No Equipment/Devices:None Discharge Condition: Stable CODE STATUS: Full Diet recommendation: Heart Healthy   Brief/Interim Summary: GaleMilleris a74 y.o.Caucasian malewith a known history of hypertension, right-sided nephrolithiasis and dyslipidemia, who presented to the emergency room with acute onset of left-sided flank pain radiating to his left groin, with inability to urinate as well as associated nausea and vomiting. He graded his pain 9-10/10 in severity yesterday and today was 2-3/10. He was seen by and prescribed hydrocodone that helped with his pain. He denies any fever or chills. No chest pain or dyspnea or cough or wheezing. On arrival he was hemodynamically stable, elevated blood pressure and AKI with creatinine peaked at 6.  CT abdomen with severe left hydronephrosis due to a 0.6 cm obstructing left distal ureteral stone.  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  Dr. Richardo Hanks from urology took him to the OR for cystoscopy and a stent placement.  Patient tolerated the procedure well.  Creatinine started trending down.   On the day of discharge it was 2.24.  UA looks infected.  Urine culture with less than 10,000 of insignificant growth. He was treated with ceftriaxone while in the hospital and does not need any more antibiotics.  Foley catheter was inserted to help with obstruction.  Foley was removed on the day of discharge and he was able to void without any difficulty.  Leukocytosis  resolved and hemoglobin remained stable.  Patient will continue with his home medications and will follow up with his primary care physician and urology for further management.  Discharge Diagnoses:  Active Problems:   Acute unilateral obstructive uropathy   Acute renal failure (HCC)   Leukocytosis   Discharge Instructions  Discharge Instructions    Diet - low sodium heart healthy   Complete by: As directed    Discharge instructions   Complete by: As directed    It was pleasure taking care of you. Please keep yourself well-hydrated and follow-up with urology. There is no need for more antibiotics.   Increase activity slowly   Complete by: As directed    No wound care   Complete by: As directed      Allergies as of 05/20/2020   No Known Allergies     Medication List    TAKE these medications   allopurinol 100 MG tablet Commonly known as: ZYLOPRIM Take 100 mg by mouth 2 (two) times daily.   aspirin EC 81 MG tablet Take 81 mg by mouth daily. Swallow whole.   atorvastatin 10 MG tablet Commonly known as: LIPITOR Take 10 mg by mouth every evening.   gabapentin 300 MG capsule Commonly known as: NEURONTIN Take 300 mg by mouth at bedtime.   lisinopril 5 MG tablet Commonly known as: ZESTRIL Take 5 mg by mouth daily.   multivitamin with minerals Tabs tablet Take 1 tablet by mouth daily.   propranolol 20 MG tablet Commonly known as: INDERAL Take 20 mg by mouth 2 (two) times daily.   vitamin B-12 1000 MCG tablet Commonly known as: CYANOCOBALAMIN Take 1,000 mcg  by mouth daily.       Follow-up Information    Gracelyn Nurse, MD. Schedule an appointment as soon as possible for a visit.   Specialty: Internal Medicine Contact information: [address] [place] [address] [phone]        Sondra Come, MD. Schedule an appointment as soon as possible for a visit.   Specialty: Urology Contact information: [address] [place]  [address] [phone]              No Known Allergies  Consultations:  Urology  Procedures/Studies: CT ABDOMEN PELVIS WO CONTRAST  Result Date: 05/18/2020 CLINICAL DATA:  Onset left flank pain yesterday. EXAM: CT ABDOMEN AND PELVIS WITHOUT CONTRAST TECHNIQUE: Multidetector CT imaging of the abdomen and pelvis was performed following the standard protocol without IV contrast. COMPARISON:  Renal ultrasound 07/12/2013. FINDINGS: Lower chest: Lung bases clear. No pleural or pericardial effusion. Heart size is upper normal. Hepatobiliary: No focal liver abnormality is seen. No gallstones, gallbladder wall thickening, or biliary dilatation. Pancreas: Unremarkable. No pancreatic ductal dilatation or surrounding inflammatory changes. Spleen: Normal in size without focal abnormality. Adrenals/Urinary Tract: The adrenal glands appear normal. The right kidney is severely atrophic and there is severe right hydroureteronephrosis. Distal right ureteral stones measuring up to 1 cm in diameter are seen. There is also 0.6 cm nonobstructing stone in the right kidney. Moderate to moderately severe left hydronephrosis with extensive stranding about the left kidney and ureter is due to a 0.6 cm stone in the distal left ureter at the level of the left hip. Multiple nonobstructing left renal stones measure up to 1 cm in diameter. The urinary bladder is completely decompressed. No stone is seen within the bladder. Stomach/Bowel: Stomach is within normal limits. Appendix appears normal. No evidence of bowel wall thickening, distention, or inflammatory changes. Diverticulosis without diverticulitis is most notable in the sigmoid colon. Vascular/Lymphatic: Aortic atherosclerosis. No enlarged abdominal or pelvic lymph nodes. Reproductive: Prostate is unremarkable. Other: Tiny fat containing umbilical hernia. Musculoskeletal: No acute or focal abnormality. Convex left lumbar scoliosis and multilevel degenerative change noted.  IMPRESSION: Moderate to moderately severe left hydronephrosis due to a 0.6 cm left ureteral stone at the level of the left hip. The patient has multiple nonobstructing left renal stones measuring up to 1 cm. Severe atrophy of the right kidney is likely due to chronic obstruction due to 1 or more stones in the distal right ureter measuring up to 1 cm in diameter. Diverticulosis without diverticulitis. Aortic Atherosclerosis (ICD10-I70.0). These results will be called to the ordering clinician or representative by the Radiologist Assistant, and communication documented in the PACS or [employer]. Electronically Signed   By: Drusilla Kanner M.D.   On: 05/18/2020 12:53    Subjective: Patient has no new complaint today.  Pain is improved.  He wants to go home.  Son was in the room.  Discharge Exam: Vitals:   05/20/20 0459 05/20/20 0746  BP: 138/75 136/75  Pulse: 66 69  Resp: 16 18  Temp: 98.5 F (36.9 C) 98.7 F (37.1 C)  SpO2: 97% 97%   Vitals:   05/19/20 2014 05/19/20 2342 05/20/20 0459 05/20/20 0746  BP: 116/67 129/81 138/75 136/75  Pulse: 85 70 66 69  Resp: 20 16 16 18   Temp: 98 F (36.7 C) 99.5 F (37.5 C) 98.5 F (36.9 C) 98.7 F (37.1 C)  TempSrc: Oral Oral Oral Oral  SpO2: 98% 98% 97% 97%  Weight:  Height:        General: Pt is alert, awake, not in acute distress Cardiovascular: RRR, S1/S2 +, no rubs, no gallops Respiratory: CTA bilaterally, no wheezing, no rhonchi Abdominal: Soft, NT, ND, bowel sounds + Extremities: no edema, no cyanosis   The results of significant diagnostics from this hospitalization (including imaging, microbiology, ancillary and laboratory) are listed below for reference.    Microbiology: Recent Results (from the past 240 hour(s))  Urine Culture     Status: Abnormal   Collection Time: 05/18/20  6:06 PM   Specimen: Urine, Catheterized  Result Value Ref Range Status   Specimen Description   Final    URINE, CATHETERIZED Performed at  [facility], [address], [place], [place] [address]    Special Requests   Final    Normal Performed at [facility], [address] [place], [place] [address]    Culture (A)  Final    <10,000 COLONIES/mL INSIGNIFICANT GROWTH Performed at [facility], [address] [place], [place] [address]    Report Status 05/20/2020 FINAL  Final  SARS Coronavirus 2 by RT PCR (hospital order, performed in [facility] hospital lab) Nasopharyngeal Nasopharyngeal Swab     Status: None   Collection Time: 05/19/20 12:39 AM   Specimen: Nasopharyngeal Swab  Result Value Ref Range Status   SARS Coronavirus 2 NEGATIVE NEGATIVE Final    Comment: (NOTE) SARS-CoV-2 target nucleic acids are NOT DETECTED.  The SARS-CoV-2 RNA is generally detectable in upper and lower respiratory specimens during the acute phase of infection. The lowest concentration of SARS-CoV-2 viral copies this assay can detect is 250 copies / mL. A negative result does not preclude SARS-CoV-2 infection and should not be used as the sole basis for treatment or other patient management decisions.  A negative result may occur with improper specimen collection / handling, submission of specimen other than nasopharyngeal swab, presence of viral mutation(s) within the areas targeted by this assay, and inadequate number of viral copies (<250 copies / mL). A negative result must be combined with clinical observations, patient history, and epidemiological information.  Fact Sheet for Patients:   BoilerBrush.com.cy  Fact Sheet for Healthcare Providers: https://pope.com/  This test is not yet approved or  cleared by the [place] FDA and has been authorized for detection and/or diagnosis of SARS-CoV-2 by FDA under an Emergency Use Authorization (EUA).  This EUA will remain in effect (meaning this test can be used) for the duration of the COVID-19 declaration  under Section 564(b)(1) of the Act, 21 U.S.C. section 360bbb-3(b)(1), unless the authorization is terminated or revoked sooner.  Performed at [facility], [address] [place], [place] [address]      Labs: BNP (last 3 results) No results for input(s): BNP in the last 8760 hours. Basic Metabolic Panel: Recent Labs  Lab 05/18/20 2326 05/19/20 0348 05/19/20 0832 05/20/20 1005  NA 135 136 136 137  K 5.2* 4.9 4.5 4.2  CL 102 105 106 106  CO2 22 20* 22 22  GLUCOSE 115* 118* 107* 135*  BUN 56* 56* 53* 40*  CREATININE 5.88* 6.20* 4.89* 2.24*  CALCIUM 9.3 8.5* 8.3* 8.7*   Liver Function Tests: Recent Labs  Lab 05/18/20 2326  AST 48*  ALT 31  ALKPHOS 67  BILITOT 2.8*  PROT 7.9  ALBUMIN 4.4   Recent Labs  Lab 05/18/20 2326  LIPASE 25   No results for input(s): AMMONIA in the last 168 hours. CBC:  Recent Labs  Lab 05/18/20 2326 05/19/20 0348 05/20/20 1005  WBC 20.3* 20.3* 9.1  HGB 14.4 12.3* 12.5*  HCT 42.1 35.8* 36.5*  MCV 95.7 96.2 96.1  PLT 200 155 144*   Cardiac Enzymes: No results for input(s): CKTOTAL, CKMB, CKMBINDEX, TROPONINI in the last 168 hours. BNP: Invalid input(s): POCBNP CBG: No results for input(s): GLUCAP in the last 168 hours. D-Dimer No results for input(s): DDIMER in the last 72 hours. Hgb A1c No results for input(s): HGBA1C in the last 72 hours. Lipid Profile No results for input(s): CHOL, HDL, LDLCALC, TRIG, CHOLHDL, LDLDIRECT in the last 72 hours. Thyroid function studies No results for input(s): TSH, T4TOTAL, T3FREE, THYROIDAB in the last 72 hours.  Invalid input(s): FREET3 Anemia work up No results for input(s): VITAMINB12, FOLATE, FERRITIN, TIBC, IRON, RETICCTPCT in the last 72 hours. Urinalysis    Component Value Date/Time   COLORURINE YELLOW (A) 05/18/2020 1802   APPEARANCEUR CLOUDY (A) 05/18/2020 1802   LABSPEC 1.009 05/18/2020 1802   PHURINE 7.0 05/18/2020 1802   GLUCOSEU 50 (A) 05/18/2020 1802   HGBUR  LARGE (A) 05/18/2020 1802   BILIRUBINUR NEGATIVE 05/18/2020 1802   KETONESUR NEGATIVE 05/18/2020 1802   PROTEINUR >=300 (A) 05/18/2020 1802   NITRITE NEGATIVE 05/18/2020 1802   LEUKOCYTESUR LARGE (A) 05/18/2020 1802   Sepsis Labs Invalid input(s): PROCALCITONIN,  WBC,  LACTICIDVEN Microbiology Recent Results (from the past 240 hour(s))  Urine Culture     Status: Abnormal   Collection Time: 05/18/20  6:06 PM   Specimen: Urine, Catheterized  Result Value Ref Range Status   Specimen Description   Final    URINE, CATHETERIZED Performed at [facility], [address], [place], [place] [address]    Special Requests   Final    Normal Performed at [facility], [address] [place], [place] [address]    Culture (A)  Final    <10,000 COLONIES/mL INSIGNIFICANT GROWTH Performed at [facility], [address], [place], [place] [address]    Report Status 05/20/2020 FINAL  Final  SARS Coronavirus 2 by RT PCR (hospital order, performed in [facility] hospital lab) Nasopharyngeal Nasopharyngeal Swab     Status: None   Collection Time: 05/19/20 12:39 AM   Specimen: Nasopharyngeal Swab  Result Value Ref Range Status   SARS Coronavirus 2 NEGATIVE NEGATIVE Final    Comment: (NOTE) SARS-CoV-2 target nucleic acids are NOT DETECTED.  The SARS-CoV-2 RNA is generally detectable in upper and lower respiratory specimens during the acute phase of infection. The lowest concentration of SARS-CoV-2 viral copies this assay can detect is 250 copies / mL. A negative result does not preclude SARS-CoV-2 infection and should not be used as the sole basis for treatment or other patient management decisions.  A negative result may occur with improper specimen collection / handling, submission of specimen other than nasopharyngeal swab, presence of viral mutation(s) within the areas targeted by this assay, and inadequate number of viral copies (<250 copies / mL). A negative result  must be combined with clinical observations, patient history, and epidemiological information.  Fact Sheet for Patients:   BoilerBrush.com.cy  Fact Sheet for Healthcare Providers: https://pope.com/  This test is not yet approved or  cleared by the [place] FDA and has been authorized for detection and/or diagnosis of SARS-CoV-2 by FDA under an Emergency Use Authorization (EUA).  This EUA will remain in effect (meaning this test can be used) for the duration of the COVID-19 declaration  under Section 564(b)(1) of the Act, 21 U.S.C. section 360bbb-3(b)(1), unless the authorization is terminated or revoked sooner.  Performed at [facility], [address] [place], [place] [address]     Time coordinating discharge: Over 30 minutes  SIGNED:  Arnetha Courser, MD  [facility] 05/20/2020, 3:04 PM  If 7PM-7AM, please contact night-coverage [email or web address]  This record has been created using Conservation officer, historic buildings. Errors have been sought and corrected,but may not always be located. Such creation errors do not reflect on the standard of care.

## 2020-05-20 NOTE — Progress Notes (Signed)
Pt was discharge at 1630. AVS paperwork was discussed , patient and sons were receptive and conveyed understanding. PIV was removed. Pt was escorted off the unit via wheelchair by nurse.

## 2020-05-20 NOTE — Progress Notes (Addendum)
   Subjective Afebrile, no acute events, flank pain resolved   Physical Exam: BP 136/75 (BP Location: Right Arm)   Pulse 69   Temp 98.7 F (37.1 C) (Oral)   Resp 18   Ht 5\' 10"  (1.778 m)   Wt 93.9 kg   SpO2 97%   BMI 29.70 kg/m    Constitutional:  Alert and oriented, No acute distress. Respiratory: Normal respiratory effort, no increased work of breathing. 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 POD#1 cystoscopy and left ureteral stent placement.  Okay for Foley removal today Follow-up morning labs, if renal function improving okay for discharge from urology perspective No need for treatment dose antibiotics, but would recommend daily Bactrim DS x 2 weeks for UTI prevention with stent in We will arrange outpatient ureteroscopy, laser lithotripsy, and stent exchange in ~2 weeks  I spent 30 total minutes on the floor with greater than 50% spent in counseling and coordination of care with the patient and his son regarding AKI, left ureteral stone, need for definitive treatment and follow-up, and chronic atrophic right kidney.   61, MD

## 2020-05-22 NOTE — Anesthesia Postprocedure Evaluation (Signed)
Anesthesia Post Note  Patient: Mearle Drew  Procedure(s) Performed: CYSTOSCOPY WITH STENT PLACEMENT (Left ) CYSTOSCOPY WITH RETROGRADE PYELOGRAM  Patient location during evaluation: PACU Anesthesia Type: General Level of consciousness: awake and alert Pain management: pain level controlled Vital Signs Assessment: post-procedure vital signs reviewed and stable Respiratory status: spontaneous breathing, nonlabored ventilation and respiratory function stable Cardiovascular status: blood pressure returned to baseline and stable Postop Assessment: no apparent nausea or vomiting Anesthetic complications: no   No complications documented.   Last Vitals:  Vitals:   05/20/20 0459 05/20/20 0746  BP: 138/75 136/75  Pulse: 66 69  Resp: 16 18  Temp: 36.9 C 37.1 C  SpO2: 97% 97%    Last Pain:  Vitals:   05/20/20 1700  TempSrc:   PainSc: 0-No pain                 Aurelio Brash Koty Anctil

## 2020-05-24 ENCOUNTER — Other Ambulatory Visit: Payer: Self-pay | Admitting: Urology

## 2020-05-24 DIAGNOSIS — N201 Calculus of ureter: Secondary | ICD-10-CM

## 2020-05-25 ENCOUNTER — Encounter
Admission: RE | Admit: 2020-05-25 | Discharge: 2020-05-25 | Disposition: A | Discharge status: Home / Self Care | Payer: Medicare Other | Source: Ambulatory Visit | Attending: Urology | Admitting: Urology

## 2020-05-25 ENCOUNTER — Other Ambulatory Visit: Payer: Self-pay

## 2020-05-25 HISTORY — DX: Essential (primary) hypertension: I10

## 2020-05-25 HISTORY — DX: Unspecified osteoarthritis, unspecified site: M19.90

## 2020-05-25 HISTORY — DX: Anemia, unspecified: D64.9

## 2020-05-25 NOTE — Patient Instructions (Signed)
Your procedure is scheduled on: Friday June 02, 2020. Report to Day Surgery inside [facility] 2nd floor. To find out your arrival time please call [phone] between 1PM - 3PM on Thursday June 01, 2020.  Remember: Instructions that are not followed completely may result in serious medical risk,  up to and including death, or upon the discretion of your surgeon and anesthesiologist your  surgery may need to be rescheduled.     _X__ 1. Do not eat food after midnight the night before your procedure.                 No gum chewing or hard candies. You may drink clear liquids up to 2 hours                 before you are scheduled to arrive for your surgery- DO not drink clear                 liquids within 2 hours of the start of your surgery.                 Clear Liquids include:  water, apple juice without pulp, clear Gatorade, G2 or                  Gatorade Zero (avoid Red/Purple/Blue), Black Coffee or Tea (Do not add                 anything to coffee or tea).  __X__2.  On the morning of surgery brush your teeth with toothpaste and water, you                may rinse your mouth with mouthwash if you wish.  Do not swallow any toothpaste of mouthwash.     _X__ 3.  No Alcohol for 24 hours before or after surgery.   _X__ 4.  Do Not Smoke or use e-cigarettes For 24 Hours Prior to Your Surgery.                 Do not use any chewable tobacco products for at least 6 hours prior to                 Surgery.  _X__  5.  Do not use any recreational drugs (marijuana, cocaine, heroin, ecstacy, MDMA or other)                For at least one week prior to your surgery.  Combination of these drugs with anesthesia                May have life threatening results.  __X__ 6.  Notify your doctor if there is any change in your medical condition      (cold, fever, infections).     Do not wear jewelry, make-up, hairpins, clips or nail polish. Do not wear lotions, powders, or  perfumes. You may wear deodorant. Do not shave 48 hours prior to surgery. Men may shave face and neck. Do not bring valuables to the hospital.    [facility] is not responsible for any belongings or valuables.  Contacts, dentures or bridgework may not be worn into surgery. Leave your suitcase in the car. After surgery it may be brought to your room. For patients admitted to the hospital, discharge time is determined by your treatment team.   Patients discharged the day of surgery will not be allowed to drive home.   Make arrangements for someone to be  with you for the first 24 hours of your Same Day Discharge.   __X__ Take these medicines the morning of surgery with A SIP OF WATER:    1. propranolol (INDERAL) 20 MG  __X__ Stop aspirin as instructed by your provider.   __X__ Stop Anti-inflammatories such as Ibuprofen, Advil, Aleve, naproxen and or BC powders.    __X__ Stop supplements until after surgery.    __X__ Do not start any herbal supplements before your surgery.

## 2020-05-26 ENCOUNTER — Ambulatory Visit: Admission: RE | Admit: 2020-05-26 | Payer: Self-pay | Source: Ambulatory Visit

## 2020-05-31 ENCOUNTER — Other Ambulatory Visit: Payer: Self-pay

## 2020-05-31 ENCOUNTER — Encounter
Admission: RE | Admit: 2020-05-31 | Discharge: 2020-05-31 | Disposition: A | Discharge status: Home / Self Care | Payer: Medicare Other | Source: Ambulatory Visit | Attending: Urology | Admitting: Urology

## 2020-05-31 ENCOUNTER — Encounter: Payer: Self-pay | Admitting: Urgent Care

## 2020-05-31 DIAGNOSIS — Z20822 Contact with and (suspected) exposure to covid-19: Secondary | ICD-10-CM | POA: Diagnosis not present

## 2020-05-31 DIAGNOSIS — Z01818 Encounter for other preprocedural examination: Secondary | ICD-10-CM | POA: Diagnosis not present

## 2020-05-31 LAB — CBC
HCT: 38.4 % — ABNORMAL LOW (ref 39.0–52.0)
Hemoglobin: 13 g/dL (ref 13.0–17.0)
MCH: 32.3 pg (ref 26.0–34.0)
MCHC: 33.9 g/dL (ref 30.0–36.0)
MCV: 95.5 fL (ref 80.0–100.0)
Platelets: 263 10*3/uL (ref 150–400)
RBC: 4.02 MIL/uL — ABNORMAL LOW (ref 4.22–5.81)
RDW: 12.8 % (ref 11.5–15.5)
WBC: 6.6 10*3/uL (ref 4.0–10.5)
nRBC: 0 % (ref 0.0–0.2)

## 2020-05-31 LAB — BASIC METABOLIC PANEL
Anion gap: 5 (ref 5–15)
BUN: 37 mg/dL — ABNORMAL HIGH (ref 8–23)
CO2: 24 mmol/L (ref 22–32)
Calcium: 9.2 mg/dL (ref 8.9–10.3)
Chloride: 105 mmol/L (ref 98–111)
Creatinine, Ser: 1.84 mg/dL — ABNORMAL HIGH (ref 0.61–1.24)
GFR calc Af Amer: 41 mL/min — ABNORMAL LOW (ref >60)
GFR calc non Af Amer: 35 mL/min — ABNORMAL LOW (ref >60)
Glucose, Bld: 154 mg/dL — ABNORMAL HIGH (ref 70–99)
Potassium: 4.1 mmol/L (ref 3.5–5.1)
Sodium: 134 mmol/L — ABNORMAL LOW (ref 135–145)

## 2020-05-31 LAB — SARS CORONAVIRUS 2 (TAT 6-24 HRS): SARS Coronavirus 2: NEGATIVE

## 2020-06-02 ENCOUNTER — Encounter
Admission: RE | Disposition: A | Discharge status: Home / Self Care | Payer: Self-pay | Source: Home / Self Care | Attending: Urology

## 2020-06-02 ENCOUNTER — Encounter: Payer: Self-pay | Admitting: Urology

## 2020-06-02 ENCOUNTER — Ambulatory Visit: Payer: Medicare Other

## 2020-06-02 ENCOUNTER — Ambulatory Visit: Payer: Medicare Other | Admitting: Certified Registered"

## 2020-06-02 ENCOUNTER — Ambulatory Visit
Admission: RE | Admit: 2020-06-02 | Discharge: 2020-06-02 | Disposition: A | Discharge status: Home / Self Care | Payer: Medicare Other | Attending: Urology | Admitting: Urology

## 2020-06-02 ENCOUNTER — Telehealth: Payer: Self-pay | Admitting: Urology

## 2020-06-02 ENCOUNTER — Other Ambulatory Visit: Payer: Self-pay

## 2020-06-02 DIAGNOSIS — E785 Hyperlipidemia, unspecified: Secondary | ICD-10-CM | POA: Insufficient documentation

## 2020-06-02 DIAGNOSIS — M109 Gout, unspecified: Secondary | ICD-10-CM | POA: Insufficient documentation

## 2020-06-02 DIAGNOSIS — N132 Hydronephrosis with renal and ureteral calculous obstruction: Secondary | ICD-10-CM | POA: Insufficient documentation

## 2020-06-02 DIAGNOSIS — N261 Atrophy of kidney (terminal): Secondary | ICD-10-CM | POA: Insufficient documentation

## 2020-06-02 DIAGNOSIS — M1990 Unspecified osteoarthritis, unspecified site: Secondary | ICD-10-CM | POA: Diagnosis not present

## 2020-06-02 DIAGNOSIS — N201 Calculus of ureter: Secondary | ICD-10-CM

## 2020-06-02 DIAGNOSIS — I10 Essential (primary) hypertension: Secondary | ICD-10-CM | POA: Diagnosis not present

## 2020-06-02 DIAGNOSIS — N200 Calculus of kidney: Secondary | ICD-10-CM

## 2020-06-02 DIAGNOSIS — F419 Anxiety disorder, unspecified: Secondary | ICD-10-CM | POA: Diagnosis not present

## 2020-06-02 HISTORY — PX: CYSTOSCOPY/URETEROSCOPY/HOLMIUM LASER/STENT PLACEMENT: SHX6546

## 2020-06-02 SURGERY — CYSTOSCOPY/URETEROSCOPY/HOLMIUM LASER/STENT PLACEMENT
Anesthesia: General | Laterality: Left

## 2020-06-02 MED ORDER — FENTANYL CITRATE (PF) 100 MCG/2ML IJ SOLN
INTRAMUSCULAR | Status: DC | PRN
  Administered 2020-06-02 (×2): 50 ug via INTRAVENOUS

## 2020-06-02 MED ORDER — GLYCOPYRROLATE 0.2 MG/ML IJ SOLN
INTRAMUSCULAR | Status: AC
  Filled 2020-06-02: qty 1

## 2020-06-02 MED ORDER — PROPOFOL 10 MG/ML IV BOLUS
INTRAVENOUS | Status: DC | PRN
  Administered 2020-06-02: 120 mg via INTRAVENOUS

## 2020-06-02 MED ORDER — BELLADONNA ALKALOIDS-OPIUM 16.2-60 MG RE SUPP
RECTAL | Status: DC | PRN
  Administered 2020-06-02: 1 via RECTAL

## 2020-06-02 MED ORDER — OXYCODONE HCL 5 MG PO TABS: 5.0000 mg | ORAL_TABLET | Freq: Once | ORAL | Status: DC | PRN

## 2020-06-02 MED ORDER — FENTANYL CITRATE (PF) 100 MCG/2ML IJ SOLN: 25.0000 ug | INTRAMUSCULAR | Status: DC | PRN

## 2020-06-02 MED ORDER — NITROFURANTOIN MACROCRYSTAL 100 MG PO CAPS
100.0000 mg | ORAL_CAPSULE | Freq: Every day | ORAL | 0 refills | Status: AC
Start: 2020-06-02 — End: 2020-06-16

## 2020-06-02 MED ORDER — SUGAMMADEX SODIUM 200 MG/2ML IV SOLN
INTRAVENOUS | Status: DC | PRN
  Administered 2020-06-02: 300 mg via INTRAVENOUS

## 2020-06-02 MED ORDER — CEFAZOLIN SODIUM-DEXTROSE 2-4 GM/100ML-% IV SOLN
INTRAVENOUS | Status: AC
  Filled 2020-06-02: qty 100

## 2020-06-02 MED ORDER — CEFAZOLIN SODIUM-DEXTROSE 2-4 GM/100ML-% IV SOLN
2.0000 g | INTRAVENOUS | Status: AC
  Administered 2020-06-02: 2 g via INTRAVENOUS

## 2020-06-02 MED ORDER — PHENYLEPHRINE HCL (PRESSORS) 10 MG/ML IV SOLN
INTRAVENOUS | Status: DC | PRN
  Administered 2020-06-02: 200 ug via INTRAVENOUS
  Administered 2020-06-02 (×4): 100 ug via INTRAVENOUS

## 2020-06-02 MED ORDER — CHLORHEXIDINE GLUCONATE 0.12 % MT SOLN: 15.0000 mL | Freq: Once | OROMUCOSAL | Status: AC

## 2020-06-02 MED ORDER — FAMOTIDINE 20 MG PO TABS: 20.0000 mg | ORAL_TABLET | Freq: Once | ORAL | Status: AC

## 2020-06-02 MED ORDER — LACTATED RINGERS IV SOLN: INTRAVENOUS | Status: DC

## 2020-06-02 MED ORDER — ONDANSETRON HCL 4 MG/2ML IJ SOLN
INTRAMUSCULAR | Status: AC
  Filled 2020-06-02: qty 2

## 2020-06-02 MED ORDER — BELLADONNA ALKALOIDS-OPIUM 16.2-60 MG RE SUPP
RECTAL | Status: AC
  Filled 2020-06-02: qty 1

## 2020-06-02 MED ORDER — HYDROCODONE-ACETAMINOPHEN 5-325 MG PO TABS: 1.0000 | ORAL_TABLET | ORAL | 0 refills | Status: AC | PRN

## 2020-06-02 MED ORDER — TAMSULOSIN HCL 0.4 MG PO CAPS
0.4000 mg | ORAL_CAPSULE | Freq: Every day | ORAL | 0 refills | Status: DC
Start: 2020-06-02 — End: 2020-08-09

## 2020-06-02 MED ORDER — IOHEXOL 180 MG/ML  SOLN
INTRAMUSCULAR | Status: DC | PRN
  Administered 2020-06-02: 20 mL

## 2020-06-02 MED ORDER — GLYCOPYRROLATE 0.2 MG/ML IJ SOLN
INTRAMUSCULAR | Status: DC | PRN
  Administered 2020-06-02: .2 mg via INTRAVENOUS

## 2020-06-02 MED ORDER — ONDANSETRON HCL 4 MG/2ML IJ SOLN
INTRAMUSCULAR | Status: DC | PRN
  Administered 2020-06-02 (×2): 4 mg via INTRAVENOUS

## 2020-06-02 MED ORDER — FAMOTIDINE 20 MG PO TABS
ORAL_TABLET | ORAL | Status: AC
  Administered 2020-06-02: 20 mg via ORAL
  Filled 2020-06-02: qty 1

## 2020-06-02 MED ORDER — DEXAMETHASONE SODIUM PHOSPHATE 10 MG/ML IJ SOLN
INTRAMUSCULAR | Status: AC
  Filled 2020-06-02: qty 1

## 2020-06-02 MED ORDER — CHLORHEXIDINE GLUCONATE 0.12 % MT SOLN
OROMUCOSAL | Status: AC
  Administered 2020-06-02: 15 mL via OROMUCOSAL
  Filled 2020-06-02: qty 15

## 2020-06-02 MED ORDER — ROCURONIUM BROMIDE 100 MG/10ML IV SOLN
INTRAVENOUS | Status: DC | PRN
  Administered 2020-06-02: 50 mg via INTRAVENOUS
  Administered 2020-06-02: 10 mg via INTRAVENOUS

## 2020-06-02 MED ORDER — DEXAMETHASONE SODIUM PHOSPHATE 10 MG/ML IJ SOLN
INTRAMUSCULAR | Status: DC | PRN
  Administered 2020-06-02: 10 mg via INTRAVENOUS

## 2020-06-02 MED ORDER — OXYCODONE HCL 5 MG/5ML PO SOLN: 5.0000 mg | Freq: Once | ORAL | Status: DC | PRN

## 2020-06-02 MED ORDER — FENTANYL CITRATE (PF) 100 MCG/2ML IJ SOLN
INTRAMUSCULAR | Status: AC
  Filled 2020-06-02: qty 2

## 2020-06-02 MED ORDER — ORAL CARE MOUTH RINSE: 15.0000 mL | Freq: Once | OROMUCOSAL | Status: AC

## 2020-06-02 MED ORDER — LIDOCAINE HCL (CARDIAC) PF 100 MG/5ML IV SOSY
PREFILLED_SYRINGE | INTRAVENOUS | Status: DC | PRN
  Administered 2020-06-02: 100 mg via INTRAVENOUS

## 2020-06-02 SURGICAL SUPPLY — 32 items
BAG DRAIN CYSTO-URO LG1000N (MISCELLANEOUS) ×2 IMPLANT
BRUSH SCRUB EZ 1% IODOPHOR (MISCELLANEOUS) ×2 IMPLANT
CATH URETL 5X70 OPEN END (CATHETERS) IMPLANT
CNTNR SPEC 2.5X3XGRAD LEK (MISCELLANEOUS)
CONT SPEC 4OZ STER OR WHT (MISCELLANEOUS)
CONT SPEC 4OZ STRL OR WHT (MISCELLANEOUS)
CONTAINER SPEC 2.5X3XGRAD LEK (MISCELLANEOUS) IMPLANT
DRAPE UTILITY 15X26 TOWEL STRL (DRAPES) ×2 IMPLANT
FIBER LASER TRAC TIP (UROLOGICAL SUPPLIES) ×2 IMPLANT
GLOVE BIOGEL PI IND STRL 7.5 (GLOVE) ×1 IMPLANT
GLOVE BIOGEL PI INDICATOR 7.5 (GLOVE) ×1
GOWN STRL REUS W/ TWL LRG LVL3 (GOWN DISPOSABLE) ×1 IMPLANT
GOWN STRL REUS W/ TWL XL LVL3 (GOWN DISPOSABLE) ×1 IMPLANT
GOWN STRL REUS W/TWL LRG LVL3 (GOWN DISPOSABLE) ×2
GOWN STRL REUS W/TWL XL LVL3 (GOWN DISPOSABLE) ×2
GUIDEWIRE STR DUAL SENSOR (WIRE) ×2 IMPLANT
INFUSOR MANOMETER BAG 3000ML (MISCELLANEOUS) ×2 IMPLANT
INTRODUCER DILATOR DOUBLE (INTRODUCER) IMPLANT
KIT TURNOVER CYSTO (KITS) ×2 IMPLANT
PACK CYSTO AR (MISCELLANEOUS) ×2 IMPLANT
SET CYSTO W/LG BORE CLAMP LF (SET/KITS/TRAYS/PACK) ×2 IMPLANT
SHEATH URETERAL 12FRX35CM (MISCELLANEOUS) ×2 IMPLANT
SOL .9 NS 3000ML IRR  AL (IV SOLUTION) ×1
SOL .9 NS 3000ML IRR AL (IV SOLUTION) ×1
SOL .9 NS 3000ML IRR UROMATIC (IV SOLUTION) ×1 IMPLANT
STENT URET 6FRX24 CONTOUR (STENTS) IMPLANT
STENT URET 6FRX26 CONTOUR (STENTS) IMPLANT
STENT URET 6FRX28 CONTOUR (STENTS) ×2 IMPLANT
SURGILUBE 2OZ TUBE FLIPTOP (MISCELLANEOUS) ×2 IMPLANT
SYR 10ML LL (SYRINGE) ×2 IMPLANT
VALVE UROSEAL ADJ ENDO (VALVE) ×2 IMPLANT
WATER STERILE IRR 1000ML POUR (IV SOLUTION) ×2 IMPLANT

## 2020-06-02 NOTE — Telephone Encounter (Signed)
-----   Message from Sondra Come, MD sent at 06/02/2020 10:11 AM EDT ----- Regarding: stent removal Please set up stent removal with me in 10-15 days, thanks  Legrand Rams, MD 06/02/2020

## 2020-06-02 NOTE — Transfer of Care (Signed)
Immediate Anesthesia Transfer of Care Note  Patient: Andrew Benton  Procedure(s) Performed: CYSTOSCOPY/URETEROSCOPY/HOLMIUM LASER/STENT PLACEMENT (Left )  Patient Location: PACU  Anesthesia Type:General  Level of Consciousness: awake, drowsy and patient cooperative  Airway & Oxygen Therapy: Patient Spontanous Breathing and Patient connected to T-piece oxygen  Post-op Assessment: Report given to RN and Post -op Vital signs reviewed and stable  Post vital signs: Reviewed and stable  Last Vitals:  Vitals Value Taken Time  BP 127/78 06/02/20 1008  Temp    Pulse 82 06/02/20 1010  Resp 9 06/02/20 1010  SpO2 100 % 06/02/20 1010  Vitals shown include unvalidated device data.  Last Pain:  Vitals:   06/02/20 0739  TempSrc: Temporal  PainSc: 0-No pain         Complications: No complications documented.

## 2020-06-02 NOTE — Discharge Instructions (Signed)

## 2020-06-02 NOTE — Interval H&P Note (Signed)
UROLOGY H&P UPDATE  Agree with prior H&P dated 05/18/20. 74 yo M with chronically obstructed right kidney from large distal ureteral stone with complete right renal atrophy, previously admitted for left ureteral stone and AKI and stented. Here today for left ureteroscopy/laser lithotripsy, stent change.  We discussed at length options for his chronically obstructed and completely atrophic kidney, and I recommended observation at this time, as there is no real chance of any meaningful renal recovery with treatment of his right sided stones.  Cardiac: RRR Lungs: CTA bilaterally  Laterality: LEFT Procedure: URETEROSCOPY, LASER LITHOTRIPSY, STENT PLACEMENT  Urine: 7/15 NO GROWTH  We specifically discussed the risks ureteroscopy including bleeding, infection/sepsis, stent related symptoms including flank pain/urgency/frequency/incontinence/dysuria, ureteral injury, obstructive fragments, inability to access stone, or need for staged or additional procedures.   Sondra Come, MD 06/02/2020

## 2020-06-02 NOTE — Anesthesia Procedure Notes (Signed)
Procedure Name: Intubation Date/Time: 06/02/2020 8:50 AM Performed by: Mohammed Kindle, CRNA Pre-anesthesia Checklist: Patient identified, Emergency Drugs available, Suction available, Patient being monitored and Timeout performed Patient Re-evaluated:Patient Re-evaluated prior to induction Oxygen Delivery Method: Circle system utilized Preoxygenation: Pre-oxygenation with 100% oxygen Induction Type: IV induction Ventilation: Mask ventilation without difficulty Laryngoscope Size: McGraph and 3 Grade View: Grade II Tube type: Oral Tube size: 7.5 mm Number of attempts: 1 Airway Equipment and Method: Video-laryngoscopy Secured at: 21 cm Dental Injury: Teeth and Oropharynx as per pre-operative assessment  Difficulty Due To: Difficulty was unanticipated

## 2020-06-02 NOTE — Anesthesia Preprocedure Evaluation (Addendum)
Anesthesia Evaluation  Patient identified by MRN, date of birth, ID band Patient awake    Reviewed: Allergy & Precautions, H&P , NPO status , Patient's Chart, lab work & pertinent test results  History of Anesthesia Complications Negative for: history of anesthetic complications  Airway Mallampati: III  TM Distance: >3 FB Neck ROM: limited    Dental  (+) Chipped, Poor Dentition, Missing   Pulmonary neg pulmonary ROS, neg shortness of breath,    Pulmonary exam normal        Cardiovascular Exercise Tolerance: Good hypertension, (-) angina(-) Past MI and (-) DOE Normal cardiovascular exam     Neuro/Psych negative neurological ROS  negative psych ROS   GI/Hepatic negative GI ROS, Neg liver ROS,   Endo/Other  negative endocrine ROS  Renal/GU CRFRenal disease     Musculoskeletal  (+) Arthritis ,   Abdominal   Peds  Hematology negative hematology ROS (+)   Anesthesia Other Findings Patient endorses hoarse voice at baseline  Past Medical History: No date: Anemia No date: Arthritis     Comment:  a little bit in hands  No date: Hypertension No date: Kidney stones  Past Surgical History: 2011: COLONOSCOPY 05/19/2020: CYSTOSCOPY W/ RETROGRADES     Comment:  Procedure: CYSTOSCOPY WITH RETROGRADE PYELOGRAM;                Surgeon: Sondra Come, MD;  Location: [facility];                Service: Urology;; 05/19/2020: CYSTOSCOPY WITH STENT PLACEMENT; Left     Comment:  Procedure: CYSTOSCOPY WITH STENT PLACEMENT;  Surgeon:               Sondra Come, MD;  Location: [facility];  Service:               Urology;  Laterality: Left; 2016: EYE SURGERY No date: TONSILLECTOMY     Reproductive/Obstetrics negative OB ROS                            Anesthesia Physical Anesthesia Plan  ASA: II  Anesthesia Plan: General ETT   Post-op Pain Management:    Induction: Intravenous  PONV Risk  Score and Plan: Ondansetron, Dexamethasone, Midazolam and Treatment may vary due to age or medical condition  Airway Management Planned: Oral ETT  Additional Equipment:   Intra-op Plan:   Post-operative Plan: Extubation in OR  Informed Consent: I have reviewed the patients History and Physical, chart, labs and discussed the procedure including the risks, benefits and alternatives for the proposed anesthesia with the patient or authorized representative who has indicated his/her understanding and acceptance.     Dental Advisory Given  Plan Discussed with: Anesthesiologist, CRNA and Surgeon  Anesthesia Plan Comments: (Patient consented for risks of anesthesia including but not limited to:  - adverse reactions to medications - damage to eyes, teeth, lips or other oral mucosa - nerve damage due to positioning  - sore throat or hoarseness - Damage to heart, brain, nerves, lungs, other parts of body or loss of life  Patient voiced understanding.)        Anesthesia Quick Evaluation

## 2020-06-02 NOTE — Telephone Encounter (Signed)
APP MADE ° °

## 2020-06-02 NOTE — Op Note (Signed)
Date of procedure: 06/02/20  Preoperative diagnosis:  1. Left ureteral stone 2. Left renal stones  Postoperative diagnosis:  1. Left ureteral stone 2. Left renal stones  Procedure: 1. Cystoscopy, left retrograde pyelogram with intraoperative interpretation, left ureteral stent placement 2. Left ureteroscopy and laser lithotripsy of left distal ureteral stone 3. Left ureteroscopy and laser lithotripsy of multiple left renal stones  Surgeon: Legrand Rams, MD  Anesthesia: General  Complications: None  Intraoperative findings:  1.  Uncomplicated dusting of all left-sided stone burden, stones very hard, but dusted to <1 mm fragments 2.  Uncomplicated stent placement  EBL: Minimal  Specimens: Stone for analysis  Drains: Left 6 French by 26 cm ureteral stent  Indication: Andrew Benton is a 74 y.o. patient with essentially a solitary left kidney who previously presented with a left ureteral stone and AKI and underwent left ureteral stent placement.  After reviewing the management options for treatment, they elected to proceed with the above surgical procedure(s). We have discussed the potential benefits and risks of the procedure, side effects of the proposed treatment, the likelihood of the patient achieving the goals of the procedure, and any potential problems that might occur during the procedure or recuperation. Informed consent has been obtained.  He has a completely atrophic right kidney with severe right hydronephrosis secondary to a large right distal ureteral stone that has been present for at least 5 years.  We discussed options for management of his right side including observation, versus ureteroscopy and laser lithotripsy.  I recommended observation as there is a very low chance of any meaningful renal recovery on the right side with his complete right renal atrophy.  He is asymptomatic on the right side.  Description of procedure:  The patient was taken to the  operating room and general anesthesia was induced. SCDs were placed for DVT prophylaxis. The patient was placed in the dorsal lithotomy position, prepped and draped in the usual sterile fashion, and preoperative antibiotics were administered. A preoperative time-out was performed.   A 21 French rigid cystoscope was used to intubate the urethra and a normal-appearing urethra was followed proximally into the bladder.  Thorough cystoscopy showed no bladder lesions.  A sensor wire was advanced alongside the left ureteral stent up to the kidney under fluoroscopic vision.  The stent was then grasped and pulled to the meatus and a second safety wire was added.  A semirigid ureteroscope was advanced alongside the wires into the distal ureter and identified a 6 mm black stone and this was fragmented to dust using the 200 m laser fiber on settings of 0.5 J and 40 Hz.  These pieces were irrigated free from the ureter.    I then passed a 12/14 [place] access sheath over the wire under fluoroscopic vision and this passed easily up into the proximal ureter.  A single channel digital flexible ureteroscope advanced through the sheath and thorough pyeloscopy was performed.  There was a large 1 cm lower pole stone, and numerous stones in the mid and upper poles, including some Randall's plaques.  Using the 200 m laser fiber on after mentioned settings, all the stones were methodically dusted to pieces smaller than the laser fiber.  Thorough pyeloscopy revealed no residual fragments.  Contrast was injected from the proximal ureter and showed no filling defects or extravasation.  Careful pullback ureteroscopy demonstrated no ureteral fragments or ureteral injury.  A 6 French by 26 cm ureteral stent was uneventfully placed with a curl in the  upper pole, as well as under direct vision the bladder.  The bladder was drained, and this concluded our procedure  Disposition: Stable to PACU  Plan: Follow-up in clinic for stent  removal in 1 to 2 weeks We will need renal ultrasound at 4 to 6 weeks to evaluate for silent hydronephrosis on the left side Will need 24-hour urine metabolic work-up in setting of recurrent stones with solitary kidney  Legrand Rams, MD

## 2020-06-02 NOTE — Anesthesia Postprocedure Evaluation (Signed)
Anesthesia Post Note  Patient: Andrew Benton  Procedure(s) Performed: CYSTOSCOPY/URETEROSCOPY/HOLMIUM LASER/STENT PLACEMENT (Left )  Patient location during evaluation: PACU Anesthesia Type: General Level of consciousness: awake and alert Pain management: pain level controlled Vital Signs Assessment: post-procedure vital signs reviewed and stable Respiratory status: spontaneous breathing, nonlabored ventilation, respiratory function stable and patient connected to nasal cannula oxygen Cardiovascular status: blood pressure returned to baseline and stable Postop Assessment: no apparent nausea or vomiting Anesthetic complications: no   No complications documented.   Last Vitals:  Vitals:   06/02/20 1102 06/02/20 1117  BP: (!) 132/90 (!) 132/85  Pulse: 74 83  Resp: 16 16  Temp: (!) 36 C   SpO2: 100% 99%    Last Pain:  Vitals:   06/02/20 1117  TempSrc:   PainSc: 1                  Cleda Mccreedy Machelle Raybon

## 2020-06-03 ENCOUNTER — Encounter: Payer: Self-pay | Admitting: Urology

## 2020-06-07 ENCOUNTER — Encounter: Payer: Medicare Other | Admitting: Urology

## 2020-06-11 LAB — CALCULI, WITH PHOTOGRAPH (CLINICAL LAB)
Calcium Oxalate Monohydrate: 100 %
Weight Calculi: 25 mg

## 2020-06-14 ENCOUNTER — Other Ambulatory Visit: Payer: Self-pay

## 2020-06-14 ENCOUNTER — Ambulatory Visit (INDEPENDENT_AMBULATORY_CARE_PROVIDER_SITE_OTHER): Payer: Medicare Other | Admitting: Urology

## 2020-06-14 ENCOUNTER — Encounter: Payer: Self-pay | Admitting: Urology

## 2020-06-14 VITALS — BP 125/77 | HR 73 | Ht 70.0 in | Wt 192.0 lb

## 2020-06-14 DIAGNOSIS — N201 Calculus of ureter: Secondary | ICD-10-CM

## 2020-06-14 MED ORDER — SULFAMETHOXAZOLE-TRIMETHOPRIM 800-160 MG PO TABS
1.0000 | ORAL_TABLET | Freq: Once | ORAL | Status: AC
  Administered 2020-06-14: 1 via ORAL

## 2020-06-14 NOTE — Progress Notes (Signed)
Cystoscopy Procedure Note:  Indication: Stent removal s/p left ureteroscopy, laser lithotripsy, stent on 06/02/2020 for significant left ureteral and renal stone burden and solitary left kidney(right kidney atrophic and chronically obstructed from 1 cm distal stone for greater than 5 years)  After informed consent and discussion of the procedure and its risks, Andrew Benton was positioned and prepped in the standard fashion. Cystoscopy was performed with a flexible cystoscope. The stent was grasped with flexible graspers and removed in its entirety. The patient tolerated the procedure well.  Findings: Uncomplicated stent removal  Assessment and Plan: 24-hour metabolic work-up Stone type calcium oxalate Follow up in 4 weeks with renal ultrasound to evaluate for silent hydronephrosis and discuss 24-hour urine results  Sondra Come, MD 06/14/2020

## 2020-06-14 NOTE — Patient Instructions (Signed)

## 2020-06-16 LAB — URINALYSIS, COMPLETE
Bilirubin, UA: NEGATIVE
Glucose, UA: NEGATIVE
Ketones, UA: NEGATIVE
Nitrite, UA: NEGATIVE
Specific Gravity, UA: 1.02 (ref 1.005–1.030)
Urobilinogen, Ur: 0.2 mg/dL (ref 0.2–1.0)
pH, UA: 5 (ref 5.0–7.5)

## 2020-06-16 LAB — MICROSCOPIC EXAMINATION
RBC, Urine: >30 /hpf — AB (ref 0–2)
WBC, UA: >30 /hpf — AB (ref 0–5)

## 2020-07-11 ENCOUNTER — Other Ambulatory Visit: Payer: Medicare Other

## 2020-07-11 ENCOUNTER — Ambulatory Visit: Admission: RE | Admit: 2020-07-11 | Payer: Medicare Other | Source: Ambulatory Visit

## 2020-07-11 ENCOUNTER — Other Ambulatory Visit: Payer: Self-pay

## 2020-07-12 ENCOUNTER — Ambulatory Visit: Payer: Self-pay | Admitting: Urology

## 2020-07-17 ENCOUNTER — Other Ambulatory Visit: Payer: Self-pay | Admitting: Urology

## 2020-07-24 ENCOUNTER — Other Ambulatory Visit: Payer: Self-pay

## 2020-07-24 ENCOUNTER — Ambulatory Visit
Admission: RE | Admit: 2020-07-24 | Discharge: 2020-07-24 | Disposition: A | Discharge status: Home / Self Care | Payer: Medicare Other | Source: Ambulatory Visit | Attending: Urology | Admitting: Urology

## 2020-07-24 DIAGNOSIS — N201 Calculus of ureter: Secondary | ICD-10-CM | POA: Diagnosis not present

## 2020-08-09 ENCOUNTER — Other Ambulatory Visit: Payer: Self-pay

## 2020-08-09 ENCOUNTER — Encounter: Payer: Self-pay | Admitting: Urology

## 2020-08-09 ENCOUNTER — Ambulatory Visit: Payer: Medicare Other | Admitting: Urology

## 2020-08-09 VITALS — BP 111/68 | HR 68 | Ht 70.0 in | Wt 184.6 lb

## 2020-08-09 DIAGNOSIS — N200 Calculus of kidney: Secondary | ICD-10-CM

## 2020-08-09 DIAGNOSIS — Z87442 Personal history of urinary calculi: Secondary | ICD-10-CM | POA: Diagnosis not present

## 2020-08-09 DIAGNOSIS — N189 Chronic kidney disease, unspecified: Secondary | ICD-10-CM | POA: Insufficient documentation

## 2020-08-09 DIAGNOSIS — Z905 Acquired absence of kidney: Secondary | ICD-10-CM

## 2020-08-09 DIAGNOSIS — L408 Other psoriasis: Secondary | ICD-10-CM | POA: Insufficient documentation

## 2020-08-09 DIAGNOSIS — I10 Essential (primary) hypertension: Secondary | ICD-10-CM | POA: Insufficient documentation

## 2020-08-09 NOTE — Progress Notes (Signed)
   08/09/2020 8:13 AM   Dorisann Frames [birth year] [id number]  Reason for visit: Follow up nephrolithiasis, chronic right hydronephrosis, CKD  HPI: I saw Mr. Dupuis back in urology clinic for follow-up of the above issues.  He is a 74 year old male with an essentially solitary left kidney, as he has a completely atrophic right kidney with severe right hydronephrosis secondary to a large right distal ureteral stone that has been present for at least 6 years.  He previously presented in July 2021 with left ureteral stone and significant left renal stone burden, and underwent left ureteral stent placement followed by definitive left ureteroscopy, laser lithotripsy, and stent placement.  He underwent uncomplicated stent removal on 06/14/2020.  He denies any problems since stent removal including flank pain, gross hematuria, or stone events.  I personally reviewed his follow-up renal ultrasound on 07/24/2020 that shows no evidence of left-sided hydronephrosis or left-sided stone disease, and stable chronic severe right hydronephrosis and an atrophic right kidney.  Stone type was calcium oxalate.  We reviewed his 24-hour urine results today which were notable for a low urine volume of 1.12 L, and low urine citrate of 394, and urine pH of 5.2.  Urine sodium was normal at 68, urine calcium was normal at 96, urine oxalate was normal at 17.  We reviewed that his primary risk factors are low urine volume, and low urine citrate.  I recommended considering potassium citrate for supplementation, but he would like to avoid medications at this time.  We discussed high citrate-containing foods including fruits/vegetables, lemon juice, grapefruit juice, Crystal light lemonade.  Finally, we discussed return precautions at length including left-sided flank pain, gross hematuria, or anuria.  RTC 6 months with repeat 24-hour urine testing KUB  Sondra Come, MD  [facility] [address], [address] [place], [place] [address] [phone]

## 2020-08-09 NOTE — Patient Instructions (Addendum)
Your stone type was calcium oxalate, the most common type of stone.  Your 24-hour urine results showed that the 2 most important things to work on are increased fluid intake with goal of 2.5 L/day, and increasing citrate in the diet.  High citrate foods are lemon juice, grapefruit juice, fruits and vegetables, and Crystal light lemonade.  Citrate prevents kidney stones.  [facility] Instructions [facility]  You will receive a box/kit in the mail that will have a urine jug and instructions in the kit.  When the box arrives you will need to call our office [phone] to schedule a LAB appointment.  You will need to do a 24hour urine and this should be done during the days that our office will be open.  For example any day from Sunday through Thursday.  If you take Vitamin C 11m or greater please stop this 5 days prior to collection.  How to collect the urine sample: On the day you start the urine sample this 1st morning urine should NOT be collected.  For the rest of the day including all night urines should be collected.  On the next morning the 1st urine should be collected and then you will be finished with the urine collections.  You will need to bring the box with you on your LAB appointment day after urine has been collected and all instructions are complete in the box.  Your blood will be drawn and the box will be collected by our Lab employee to be sent off for analysis.  When urine and blood is complete you will need to schedule a follow up appointment for lab results.    Dietary Guidelines to Help Prevent Kidney Stones Kidney stones are deposits of minerals and salts that form inside your kidneys. Your risk of developing kidney stones may be greater depending on your diet, your lifestyle, the medicines you take, and whether you have certain medical conditions. Most people can reduce their chances of developing kidney stones by following the instructions below.  Depending on your overall health and the type of kidney stones you tend to develop, your dietitian may give you more specific instructions. What are tips for following this plan? Reading food labels  Choose foods with "no salt added" or "low-salt" labels. Limit your sodium intake to less than 1500 mg per day.  Choose foods with calcium for each meal and snack. Try to eat about 300 mg of calcium at each meal. Foods that contain 200-500 mg of calcium per serving include: ? 8 oz (237 ml) of milk, fortified nondairy milk, and fortified fruit juice. ? 8 oz (237 ml) of kefir, yogurt, and soy yogurt. ? 4 oz (118 ml) of tofu. ? 1 oz of cheese. ? 1 cup (300 g) of dried figs. ? 1 cup (91 g) of cooked broccoli. ? 1-3 oz can of sardines or mackerel.  Most people need 1000 to 1500 mg of calcium each day. Talk to your dietitian about how much calcium is recommended for you. Shopping  Buy plenty of fresh fruits and vegetables. Most people do not need to avoid fruits and vegetables, even if they contain nutrients that may contribute to kidney stones.  When shopping for convenience foods, choose: ? Whole pieces of fruit. ? Premade salads with dressing on the side. ? Low-fat fruit and yogurt smoothies.  Avoid buying frozen meals or prepared deli foods.  Look for foods with live cultures, such as yogurt and kefir. Cooking  Do not  add salt to food when cooking. Place a salt shaker on the table and allow each person to add his or her own salt to taste.  Use vegetable protein, such as beans, textured vegetable protein (TVP), or tofu instead of meat in pasta, casseroles, and soups. Meal planning   Eat less salt, if told by your dietitian. To do this: ? Avoid eating processed or premade food. ? Avoid eating fast food.  Eat less animal protein, including cheese, meat, poultry, or fish, if told by your dietitian. To do this: ? Limit the number of times you have meat, poultry, fish, or cheese each  week. Eat a diet free of meat at least 2 days a week. ? Eat only one serving each day of meat, poultry, fish, or seafood. ? When you prepare animal protein, cut pieces into small portion sizes. For most meat and fish, one serving is about the size of one deck of cards.  Eat at least 5 servings of fresh fruits and vegetables each day. To do this: ? Keep fruits and vegetables on hand for snacks. ? Eat 1 piece of fruit or a handful of berries with breakfast. ? Have a salad and fruit at lunch. ? Have two kinds of vegetables at dinner.  Limit foods that are high in a substance called oxalate. These include: ? Spinach. ? Rhubarb. ? Beets. ? Potato chips and french fries. ? Nuts.  If you regularly take a diuretic medicine, make sure to eat at least 1-2 fruits or vegetables high in potassium each day. These include: ? Avocado. ? Banana. ? Orange, prune, carrot, or tomato juice. ? Baked potato. ? Cabbage. ? Beans and split peas. General instructions   Drink enough fluid to keep your urine clear or pale yellow. This is the most important thing you can do.  Talk to your health care provider and dietitian about taking daily supplements. Depending on your health and the cause of your kidney stones, you may be advised: ? Not to take supplements with vitamin C. ? To take a calcium supplement. ? To take a daily probiotic supplement. ? To take other supplements such as magnesium, fish oil, or vitamin B6.  Take all medicines and supplements as told by your health care provider.  Limit alcohol intake to no more than 1 drink a day for nonpregnant women and 2 drinks a day for men. One drink equals 12 oz of beer, 5 oz of wine, or 1 oz of hard liquor.  Lose weight if told by your health care provider. Work with your dietitian to find strategies and an eating plan that works best for you. What foods are not recommended? Limit your intake of the following foods, or as told by your dietitian. Talk to  your dietitian about specific foods you should avoid based on the type of kidney stones and your overall health. Grains Breads. Bagels. Rolls. Baked goods. Salted crackers. Cereal. Pasta. Vegetables Spinach. Rhubarb. Beets. Canned vegetables. Angie Fava. Olives. Meats and other protein foods Nuts. Nut butters. Large portions of meat, poultry, or fish. Salted or cured meats. Deli meats. Hot dogs. Sausages. Dairy Cheese. Beverages Regular soft drinks. Regular vegetable juice. Seasonings and other foods Seasoning blends with salt. Salad dressings. Canned soups. Soy sauce. Ketchup. Barbecue sauce. Canned pasta sauce. Casseroles. Pizza. Lasagna. Frozen meals. Potato chips. [place] fries. Summary  You can reduce your risk of kidney stones by making changes to your diet.  The most important thing you can do is drink enough  fluid. You should drink enough fluid to keep your urine clear or pale yellow.  Ask your health care provider or dietitian how much protein from animal sources you should eat each day, and also how much salt and calcium you should have each day. This information is not intended to replace advice given to you by your health care provider. Make sure you discuss any questions you have with your health care provider. Document Revised: 02/10/2019 Document Reviewed: 10/01/2016 Elsevier Patient Education  2020 [employer].

## 2021-02-06 ENCOUNTER — Other Ambulatory Visit: Payer: Self-pay

## 2021-02-06 ENCOUNTER — Other Ambulatory Visit: Payer: Medicare Other

## 2021-02-07 ENCOUNTER — Ambulatory Visit: Payer: Self-pay | Admitting: Urology

## 2021-02-09 ENCOUNTER — Other Ambulatory Visit: Payer: Self-pay | Admitting: Urology

## 2021-02-12 ENCOUNTER — Ambulatory Visit
Admission: RE | Admit: 2021-02-12 | Discharge: 2021-02-12 | Disposition: A | Discharge status: Home / Self Care | Payer: Medicare Other | Attending: Urology | Admitting: Urology

## 2021-02-12 ENCOUNTER — Ambulatory Visit
Admission: RE | Admit: 2021-02-12 | Discharge: 2021-02-12 | Disposition: A | Discharge status: Home / Self Care | Payer: Medicare Other | Source: Ambulatory Visit | Attending: Urology | Admitting: Urology

## 2021-02-12 DIAGNOSIS — Z87442 Personal history of urinary calculi: Secondary | ICD-10-CM | POA: Insufficient documentation

## 2021-02-28 ENCOUNTER — Ambulatory Visit: Payer: Medicare Other | Admitting: Urology

## 2021-02-28 ENCOUNTER — Encounter: Payer: Self-pay | Admitting: Urology

## 2021-02-28 ENCOUNTER — Other Ambulatory Visit: Payer: Self-pay

## 2021-02-28 VITALS — BP 128/75 | HR 70 | Ht 70.0 in | Wt 180.6 lb

## 2021-02-28 DIAGNOSIS — Z87442 Personal history of urinary calculi: Secondary | ICD-10-CM | POA: Diagnosis not present

## 2021-02-28 DIAGNOSIS — Z905 Acquired absence of kidney: Secondary | ICD-10-CM

## 2021-02-28 NOTE — Progress Notes (Signed)
   02/28/2021 12:20 PM   Erich Kochan 1946/02/23 [id number]  Reason for visit: Follow up solitary left kidney, chronic right hydronephrosis, nephrolithiasis, CKD  HPI: I saw Mr. Starliper for follow-up of the above issues.He is a 75 year old male with an essentially solitary left kidney, as he has a completely atrophic right kidney with severe right hydronephrosis secondary to a large right distal ureteral stone that has been present for at least 6 years.  He previously presented in July 2021 with left ureteral stone and significant left renal stone burden, and underwent left ureteral stent placement followed by definitive left ureteroscopy, laser lithotripsy, and stent placement.  He underwent uncomplicated stent removal on 06/14/2020, and follow-up renal ultrasound showed no left hydronephrosis and stable chronic right hydronephrosis.  Stone type was calcium oxalate.  His original 24-hour urine results were notable for a low urine volume of 1.12 L, and low urine citrate of 394, and urine pH of 5.2.  Urine sodium was normal at 68, urine calcium was normal at 96, urine oxalate was normal at 17.  I offered potassium citrate, but he opted to start with diet changes alone.  He returns today with a KUB for stone surveillance and a repeat 24-hour urine.  He has had an significant improvement in his 24-hour urine results including a an excellent urine volume of 2.86 L, normal urine calcium of 143, normal urine oxalate of 21, increased citrate from prior of 425 from 394, though still on the low side, normal urine sodium of 128.  He has increased his consumption of fluids, and especially citrate containing beverages.  Renal function is stable with creatinine of 1.8.  He denies any gross hematuria, flank pain, or stone episodes since our last visit.  I personally viewed his KUB today that shows a stable large right distal stone in his atrophic right kidney, and some left pelvic calcifications.  On review  of the prior CT there are number of vascular calcifications down in the pelvis and I suspect that is what these are representing.  He does not have any symptoms that would indicate a left ureteral stone, and continues to make urine.  We discussed general stone prevention strategies including adequate hydration with goal of producing 2.5 L of urine daily, increasing citric acid intake, increasing calcium intake during high oxalate meals, minimizing animal protein, and decreasing salt intake. Information about dietary recommendations given today.   RTC 6 months with renal ultrasound, if doing well at that time can continue yearly follow-up with renal ultrasound and/or KUB  Sondra Come, MD  [facility] [address] [place], [place] [address] [phone]

## 2021-02-28 NOTE — Patient Instructions (Signed)
Dietary Guidelines to Help Prevent Kidney Stones Kidney stones are deposits of minerals and salts that form inside your kidneys. Your risk of developing kidney stones may be greater depending on your diet, your lifestyle, the medicines you take, and whether you have certain medical conditions. Most people can lower their chances of developing kidney stones by following the instructions below. Your dietitian may give you more specific instructions depending on your overall health and the type of kidney stones you tend to develop. What are tips for following this plan? Reading food labels  Choose foods with "no salt added" or "low-salt" labels. Limit your salt (sodium) intake to less than 1,500 mg a day.  Choose foods with calcium for each meal and snack. Try to eat about 300 mg of calcium at each meal. Foods that contain 200-500 mg of calcium a serving include: ? 8 oz (237 mL) of milk, calcium-fortifiednon-dairy milk, and calcium-fortifiedfruit juice. Calcium-fortified means that calcium has been added to these drinks. ? 8 oz (237 mL) of kefir, yogurt, and soy yogurt. ? 4 oz (114 g) of tofu. ? 1 oz (28 g) of cheese. ? 1 cup (150 g) of dried figs. ? 1 cup (91 g) of cooked broccoli. ? One 3 oz (85 g) can of sardines or mackerel. Most people need 1,000-1,500 mg of calcium a day. Talk to your dietitian about how much calcium is recommended for you.   Shopping  Buy plenty of fresh fruits and vegetables. Most people do not need to avoid fruits and vegetables, even if these foods contain nutrients that may contribute to kidney stones.  When shopping for convenience foods, choose: ? Whole pieces of fruit. ? Pre-made salads with dressing on the side. ? Low-fat fruit and yogurt smoothies.  Avoid buying frozen meals or prepared deli foods. These can be high in sodium.  Look for foods with live cultures, such as yogurt and kefir.  Choose high-fiber grains, such as whole-wheat breads, oat bran, and  wheat cereals. Cooking  Do not add salt to food when cooking. Place a salt shaker on the table and allow each person to add his or her own salt to taste.  Use vegetable protein, such as beans, textured vegetable protein (TVP), or tofu, instead of meat in pasta, casseroles, and soups. Meal planning  Eat less salt, if told by your dietitian. To do this: ? Avoid eating processed or pre-made food. ? Avoid eating fast food.  Eat less animal protein, including cheese, meat, poultry, or fish, if told by your dietitian. To do this: ? Limit the number of times you have meat, poultry, fish, or cheese each week. Eat a diet free of meat at least 2 days a week. ? Eat only one serving each day of meat, poultry, fish, or seafood. ? When you prepare animal protein, cut pieces into small portion sizes. For most meat and fish, one serving is about the size of the palm of your hand.  Eat at least five servings of fresh fruits and vegetables each day. To do this: ? Keep fruits and vegetables on hand for snacks. ? Eat one piece of fruit or a handful of berries with breakfast. ? Have a salad and fruit at lunch. ? Have two kinds of vegetables at dinner.  Limit foods that are high in a substance called oxalate. These include: ? Spinach (cooked), rhubarb, beets, sweet potatoes, and Swiss chard. ? Peanuts. ? Potato chips, french fries, and baked potatoes with skin on. ? Nuts and   nut products. ? Chocolate.  If you regularly take a diuretic medicine, make sure to eat at least 1 or 2 servings of fruits or vegetables that are high in potassium each day. These include: ? Avocado. ? Banana. ? Orange, prune, carrot, or tomato juice. ? Baked potato. ? Cabbage. ? Beans and split peas. Lifestyle  Drink enough fluid to keep your urine pale yellow. This is the most important thing you can do. Spread your fluid intake throughout the day.  If you drink alcohol: ? Limit how much you use to:  0-1 drink a day for  women who are not pregnant.  0-2 drinks a day for men. ? Be aware of how much alcohol is in your drink. In the [place] one drink equals one 12 oz bottle of beer (355 mL), one 5 oz glass of wine (148 mL), or one 1 oz glass of hard liquor (44 mL).  Lose weight if told by your health care provider. Work with your dietitian to find an eating plan and weight loss strategies that work best for you.   General information  Talk to your health care provider and dietitian about taking daily supplements. You may be told the following depending on your health and the cause of your kidney stones: ? Not to take supplements with vitamin C. ? To take a calcium supplement. ? To take a daily probiotic supplement. ? To take other supplements such as magnesium, fish oil, or vitamin B6.  Take over-the-counter and prescription medicines only as told by your health care provider. These include supplements. What foods should I limit? Limit your intake of the following foods, or eat them as told by your dietitian. Vegetables Spinach. Rhubarb. Beets. Canned vegetables. Pickles. Olives. Baked potatoes with skin. Grains Wheat bran. Baked goods. Salted crackers. Cereals high in sugar. Meats and other proteins Nuts. Nut butters. Large portions of meat, poultry, or fish. Salted, precooked, or cured meats, such as sausages, meat loaves, and hot dogs. Dairy Cheese. Beverages Regular soft drinks. Regular vegetable juice. Seasonings and condiments Seasoning blends with salt. Salad dressings. Soy sauce. Ketchup. Barbecue sauce. Other foods Canned soups. Canned pasta sauce. Casseroles. Pizza. Lasagna. Frozen meals. Potato chips. French fries. The items listed above may not be a complete list of foods and beverages you should limit. Contact a dietitian for more information. What foods should I avoid? Talk to your dietitian about specific foods you should avoid based on the type of kidney stones you have and your overall  health. Fruits Grapefruit. The item listed above may not be a complete list of foods and beverages you should avoid. Contact a dietitian for more information. Summary  Kidney stones are deposits of minerals and salts that form inside your kidneys.  You can lower your risk of kidney stones by making changes to your diet.  The most important thing you can do is drink enough fluid. Drink enough fluid to keep your urine pale yellow.  Talk to your dietitian about how much calcium you should have each day, and eat less salt and animal protein as told by your dietitian. This information is not intended to replace advice given to you by your health care provider. Make sure you discuss any questions you have with your health care provider. Document Revised: 10/14/2019 Document Reviewed: 10/14/2019 Elsevier Patient Education  2021 Elsevier Inc.  

## 2021-08-06 ENCOUNTER — Ambulatory Visit
Admission: RE | Admit: 2021-08-06 | Discharge: 2021-08-06 | Disposition: A | Discharge status: Home / Self Care | Payer: Medicare Other | Source: Ambulatory Visit | Attending: Urology | Admitting: Urology

## 2021-08-06 ENCOUNTER — Other Ambulatory Visit: Payer: Self-pay

## 2021-08-06 DIAGNOSIS — N132 Hydronephrosis with renal and ureteral calculous obstruction: Secondary | ICD-10-CM | POA: Insufficient documentation

## 2021-08-06 DIAGNOSIS — Z87442 Personal history of urinary calculi: Secondary | ICD-10-CM | POA: Diagnosis present

## 2021-08-06 DIAGNOSIS — Z905 Acquired absence of kidney: Secondary | ICD-10-CM | POA: Insufficient documentation

## 2021-08-30 ENCOUNTER — Encounter: Payer: Self-pay | Admitting: Urology

## 2021-08-30 ENCOUNTER — Ambulatory Visit: Payer: Medicare Other | Admitting: Urology

## 2021-08-30 ENCOUNTER — Other Ambulatory Visit: Payer: Self-pay

## 2021-08-30 VITALS — BP 128/76 | HR 75 | Ht 70.0 in | Wt 180.0 lb

## 2021-08-30 DIAGNOSIS — Z905 Acquired absence of kidney: Secondary | ICD-10-CM

## 2021-08-30 DIAGNOSIS — Z87442 Personal history of urinary calculi: Secondary | ICD-10-CM | POA: Diagnosis not present

## 2021-08-30 NOTE — Progress Notes (Signed)
   08/30/2021 9:39 AM   Andrew Benton [birth year] [id number]  Reason for visit: Follow up solitary left kidney, chronic right hydronephrosis, nephrolithiasis, CKD   HPI: I saw [name] for follow-up of the above issues.He is a 75 year old male with an essentially solitary left kidney, as he has a completely atrophic right kidney with severe right hydronephrosis secondary to a large right distal ureteral stone that has been present for at least 6 years.  He previously presented in July 2021 with left ureteral stone and significant left renal stone burden, and underwent left ureteral stent placement followed by definitive left ureteroscopy, laser lithotripsy, and stent placement.  He underwent uncomplicated stent removal on 06/14/2020, and follow-up renal ultrasound showed no left hydronephrosis and stable chronic right hydronephrosis.   Stone type was calcium oxalate.  Most recent 24-hour urine results notable for excellent urine volume of 2.86 L, normal urine calcium of 143, normal urine oxalate of 21, increased citrate from prior of 425 from 394, though still on the low side, normal urine sodium of 128.  He has increased his consumption of fluids, and especially citrate containing beverages.   Renal function is stable with creatinine of 1.8.   He denies any gross hematuria, flank pain, or stone episodes since our last visit.  I personally viewed and interpreted his renal ultrasound today that shows a stable right severe hydronephrosis with completely atrophic kidney, no left-sided hydronephrosis and possible small left mid pole nonobstructive stone.  We discussed general stone prevention strategies including adequate hydration with goal of producing 2.5 L of urine daily, increasing citric acid intake, increasing calcium intake during high oxalate meals, minimizing animal protein, and decreasing salt intake.    -RTC 1 year KUB prior -Return precautions discussed extensively with his solitary  left kidney and history of stone disease   Sondra Come, MD  [facility] [address], [address] [place], [place] [address] [phone]

## 2021-08-30 NOTE — Patient Instructions (Signed)
Dietary Guidelines to Help Prevent Kidney Stones Kidney stones are deposits of minerals and salts that form inside your kidneys. Your risk of developing kidney stones may be greater depending on your diet, your lifestyle, the medicines you take, and whether you have certain medical conditions. Most people can lower their chances of developing kidney stones by following the instructions below. Your dietitian may give you more specific instructions depending on your overall health and the type of kidney stones you tend to develop. What are tips for following this plan? Reading food labels  Choose foods with "no salt added" or "low-salt" labels. Limit your salt (sodium) intake to less than 1,500 mg a day. Choose foods with calcium for each meal and snack. Try to eat about 300 mg of calcium at each meal. Foods that contain 200-500 mg of calcium a serving include: 8 oz (237 mL) of milk, calcium-fortifiednon-dairy milk, and calcium-fortifiedfruit juice. Calcium-fortified means that calcium has been added to these drinks. 8 oz (237 mL) of kefir, yogurt, and soy yogurt. 4 oz (114 g) of tofu. 1 oz (28 g) of cheese. 1 cup (150 g) of dried figs. 1 cup (91 g) of cooked broccoli. One 3 oz (85 g) can of sardines or mackerel. Most people need 1,000-1,500 mg of calcium a day. Talk to your dietitian about how much calcium is recommended for you. Shopping Buy plenty of fresh fruits and vegetables. Most people do not need to avoid fruits and vegetables, even if these foods contain nutrients that may contribute to kidney stones. When shopping for convenience foods, choose: Whole pieces of fruit. Pre-made salads with dressing on the side. Low-fat fruit and yogurt smoothies. Avoid buying frozen meals or prepared deli foods. These can be high in sodium. Look for foods with live cultures, such as yogurt and kefir. Choose high-fiber grains, such as whole-wheat breads, oat bran, and wheat cereals. Cooking Do not add  salt to food when cooking. Place a salt shaker on the table and allow each person to add his or her own salt to taste. Use vegetable protein, such as beans, textured vegetable protein (TVP), or tofu, instead of meat in pasta, casseroles, and soups. Meal planning Eat less salt, if told by your dietitian. To do this: Avoid eating processed or pre-made food. Avoid eating fast food. Eat less animal protein, including cheese, meat, poultry, or fish, if told by your dietitian. To do this: Limit the number of times you have meat, poultry, fish, or cheese each week. Eat a diet free of meat at least 2 days a week. Eat only one serving each day of meat, poultry, fish, or seafood. When you prepare animal protein, cut pieces into small portion sizes. For most meat and fish, one serving is about the size of the palm of your hand. Eat at least five servings of fresh fruits and vegetables each day. To do this: Keep fruits and vegetables on hand for snacks. Eat one piece of fruit or a handful of berries with breakfast. Have a salad and fruit at lunch. Have two kinds of vegetables at dinner. Limit foods that are high in a substance called oxalate. These include: Spinach (cooked), rhubarb, beets, sweet potatoes, and Swiss chard. Peanuts. Potato chips, french fries, and baked potatoes with skin on. Nuts and nut products. Chocolate. If you regularly take a diuretic medicine, make sure to eat at least 1 or 2 servings of fruits or vegetables that are high in potassium each day. These include: Avocado. Banana. Orange, prune,   carrot, or tomato juice. Baked potato. Cabbage. Beans and split peas. Lifestyle  Drink enough fluid to keep your urine pale yellow. This is the most important thing you can do. Spread your fluid intake throughout the day. If you drink alcohol: Limit how much you use to: 0-1 drink a day for women who are not pregnant. 0-2 drinks a day for men. Be aware of how much alcohol is in your  drink. In the [place] one drink equals one 12 oz bottle of beer (355 mL), one 5 oz glass of wine (148 mL), or one 1 oz glass of hard liquor (44 mL). Lose weight if told by your health care provider. Work with your dietitian to find an eating plan and weight loss strategies that work best for you. General information Talk to your health care provider and dietitian about taking daily supplements. You may be told the following depending on your health and the cause of your kidney stones: Not to take supplements with vitamin C. To take a calcium supplement. To take a daily probiotic supplement. To take other supplements such as magnesium, fish oil, or vitamin B6. Take over-the-counter and prescription medicines only as told by your health care provider. These include supplements. What foods should I limit? Limit your intake of the following foods, or eat them as told by your dietitian. Vegetables Spinach. Rhubarb. Beets. Canned vegetables. Pickles. Olives. Baked potatoes with skin. Grains Wheat bran. Baked goods. Salted crackers. Cereals high in sugar. Meats and other proteins Nuts. Nut butters. Large portions of meat, poultry, or fish. Salted, precooked, or cured meats, such as sausages, meat loaves, and hot dogs. Dairy Cheese. Beverages Regular soft drinks. Regular vegetable juice. Seasonings and condiments Seasoning blends with salt. Salad dressings. Soy sauce. Ketchup. Barbecue sauce. Other foods Canned soups. Canned pasta sauce. Casseroles. Pizza. Lasagna. Frozen meals. Potato chips. French fries. The items listed above may not be a complete list of foods and beverages you should limit. Contact a dietitian for more information. What foods should I avoid? Talk to your dietitian about specific foods you should avoid based on the type of kidney stones you have and your overall health. Fruits Grapefruit. The item listed above may not be a complete list of foods and beverages you should  avoid. Contact a dietitian for more information. Summary Kidney stones are deposits of minerals and salts that form inside your kidneys. You can lower your risk of kidney stones by making changes to your diet. The most important thing you can do is drink enough fluid. Drink enough fluid to keep your urine pale yellow. Talk to your dietitian about how much calcium you should have each day, and eat less salt and animal protein as told by your dietitian. This information is not intended to replace advice given to you by your health care provider. Make sure you discuss any questions you have with your health care provider. Document Revised: 10/14/2019 Document Reviewed: 10/14/2019 Elsevier Patient Education  2022 Elsevier Inc.  

## 2021-11-19 IMAGING — CT CT ABD-PELV W/O CM
2 of 4 series · 15 of 46 positions shown, 17 images · non-contrast
Comparison: Renal ultrasound 07/12/2013.

CLINICAL DATA: Onset left flank pain yesterday.

EXAM:
CT ABDOMEN AND PELVIS WITHOUT CONTRAST
TECHNIQUE: Multidetector CT imaging of the abdomen and pelvis was performed
following the standard protocol without IV contrast.

[Series 2: stone full standard · axial · 0.81mm/px · z∈[-1132,-636]mm · 12 of 109 slices shown, 14 images]
[im 5/109  soft-tissue]
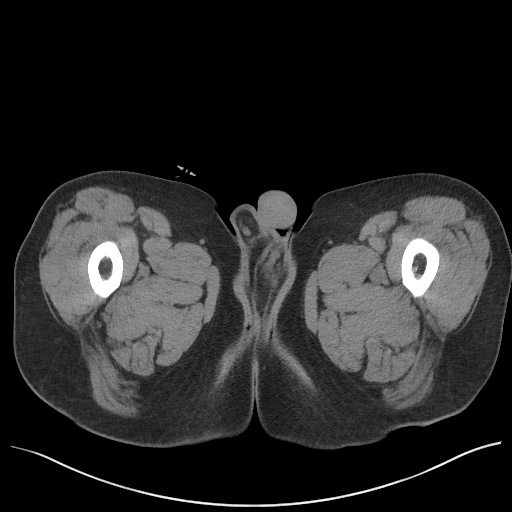
[im 5/109  bone]
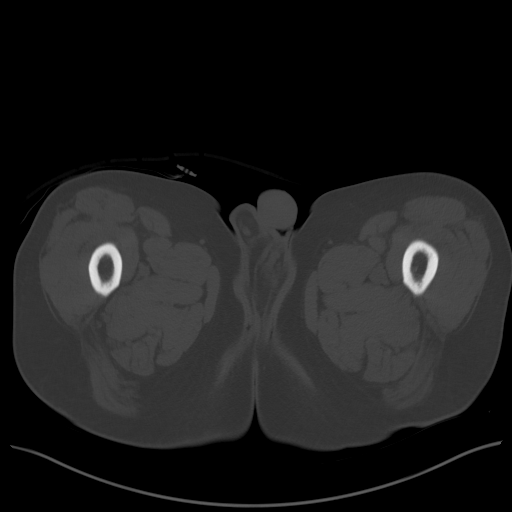
[im 15/109  soft-tissue]
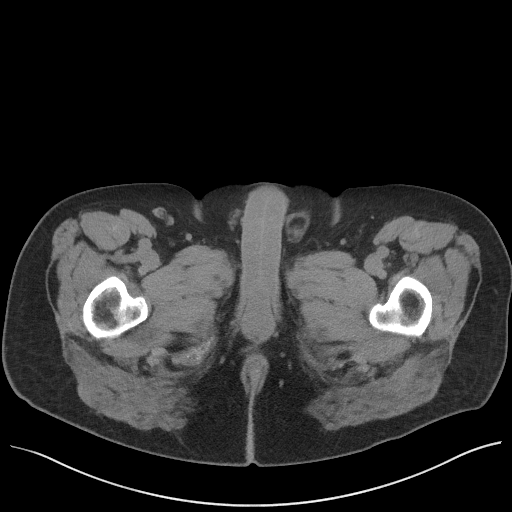
[im 24/109  soft-tissue]
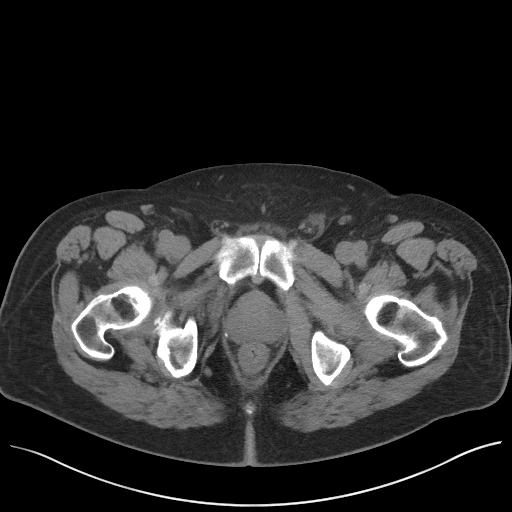
[im 33/109  soft-tissue]
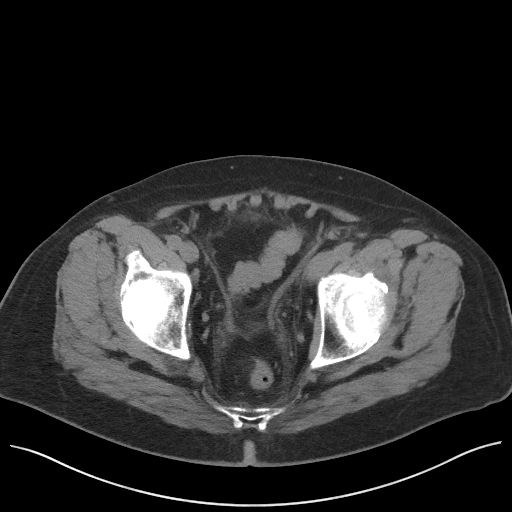
[im 43/109  soft-tissue]
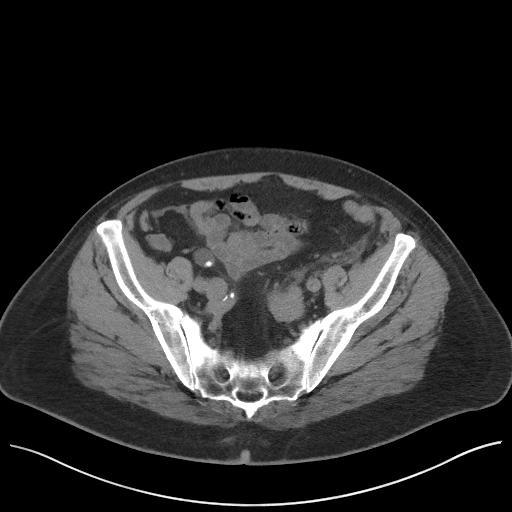
[im 52/109  soft-tissue]
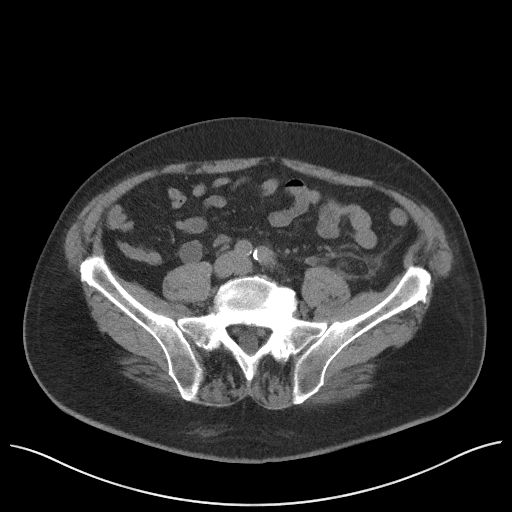
[im 57/109  soft-tissue]
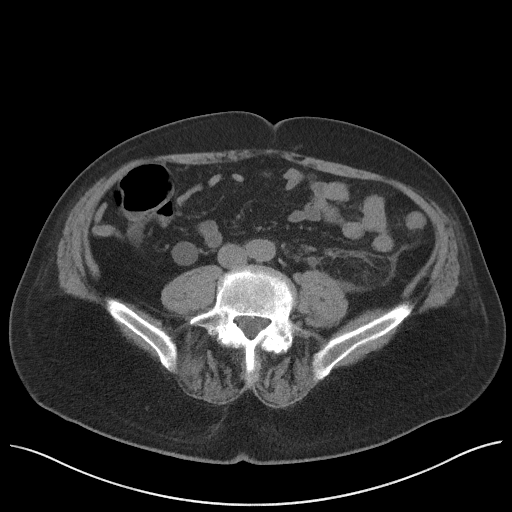
[im 66/109  soft-tissue]
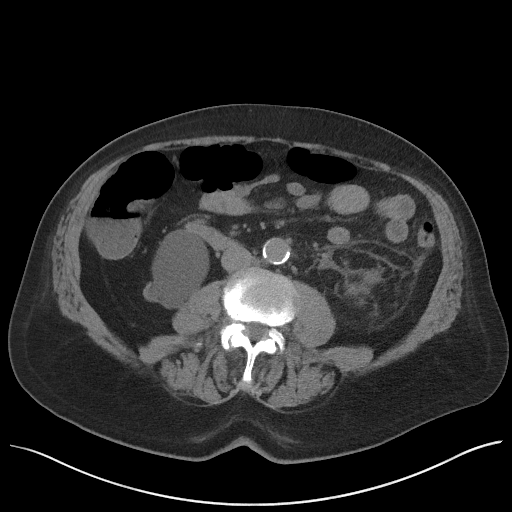
[im 76/109  soft-tissue]
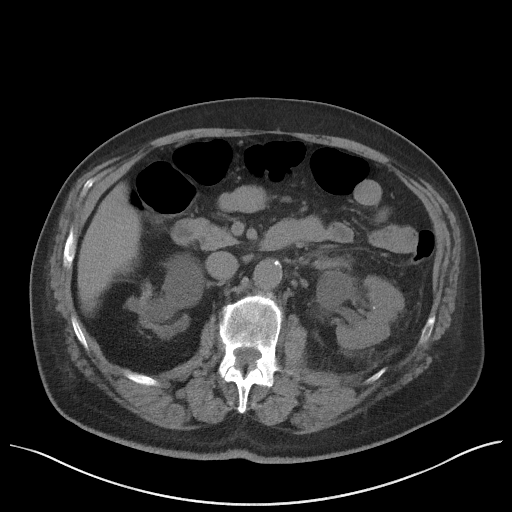
[im 76/109  bone]
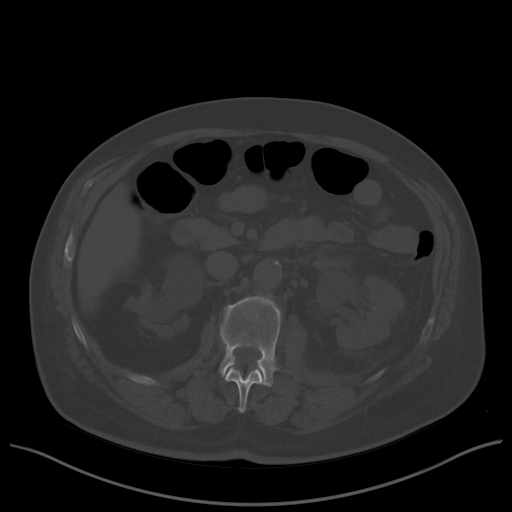
[im 85/109  soft-tissue]
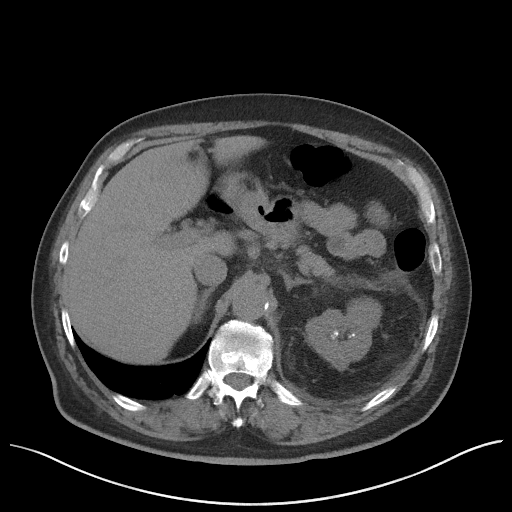
[im 94/109  soft-tissue]
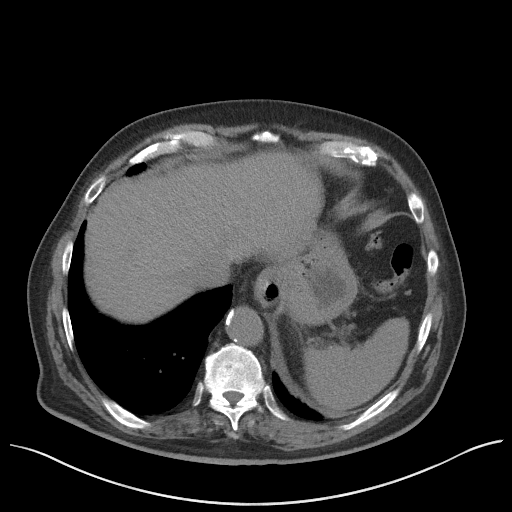
[im 104/109  soft-tissue]
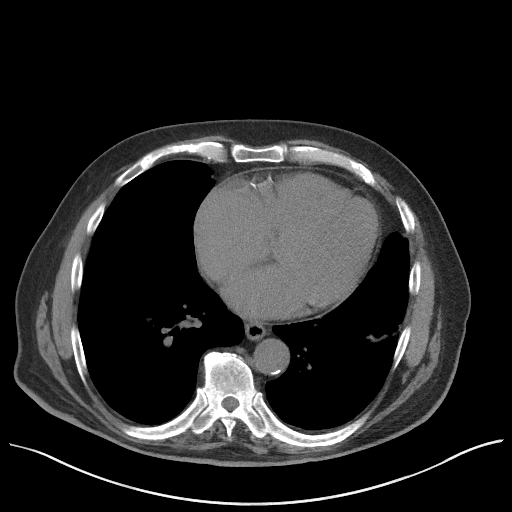

[Series 6: coronal · coronal · 0.85mm/px · 3 of 152 slices shown]
[im 51/152  soft-tissue]
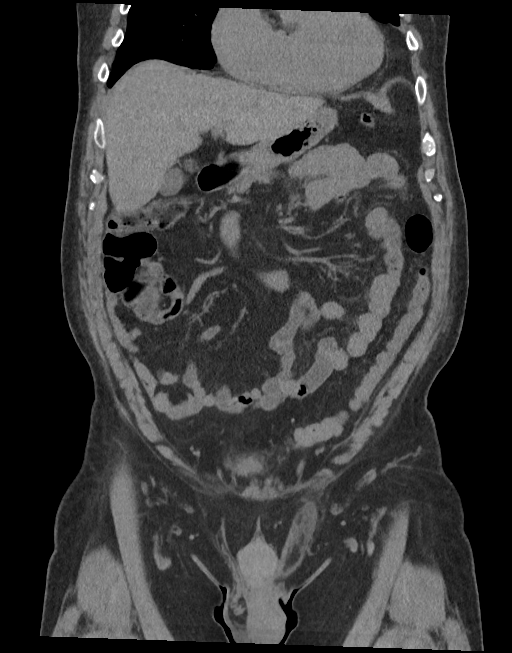
[im 68/152  soft-tissue]
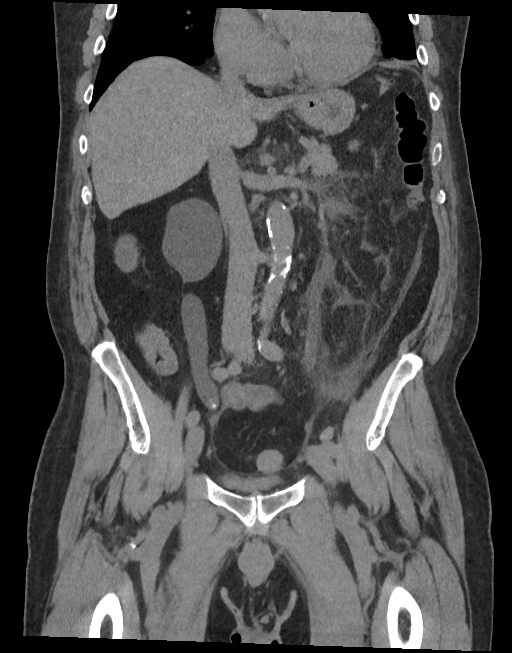
[im 84/152  soft-tissue]
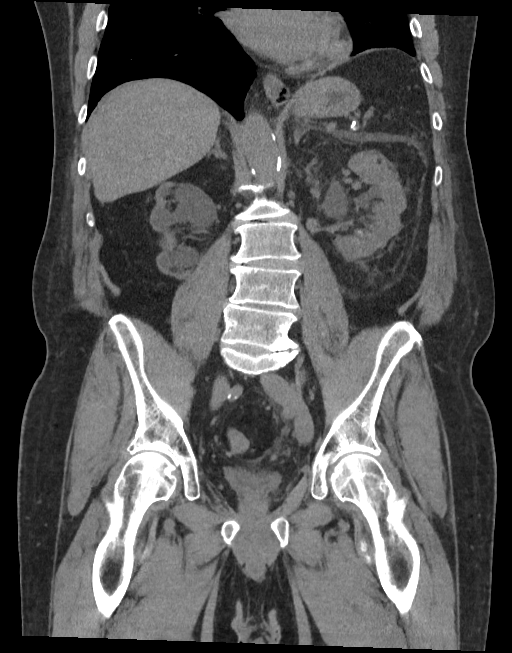

[15 of 46 positions shown; findings below may reference images not displayed]

FINDINGS: Lower chest: Lung bases clear. No pleural or pericardial effusion.
Heart size is upper normal.

Hepatobiliary: No focal liver abnormality is seen. No gallstones,
gallbladder wall thickening, or biliary dilatation.

Pancreas: Unremarkable. No pancreatic ductal dilatation or
surrounding inflammatory changes.

Spleen: Normal in size without focal abnormality.

Adrenals/Urinary Tract: The adrenal glands appear normal.

The right kidney is severely atrophic and there is severe right
hydroureteronephrosis. Distal right ureteral stones measuring up to
1 cm in diameter are seen. There is also 0.6 cm nonobstructing stone
in the right kidney.

Moderate to moderately severe left hydronephrosis with extensive
stranding about the left kidney and ureter is due to a 0.6 cm stone
in the distal left ureter at the level of the left hip. Multiple
nonobstructing left renal stones measure up to 1 cm in diameter.

The urinary bladder is completely decompressed. No stone is seen
within the bladder.

Stomach/Bowel: Stomach is within normal limits. Appendix appears
normal. No evidence of bowel wall thickening, distention, or
inflammatory changes. Diverticulosis without diverticulitis is most
notable in the sigmoid colon.

Vascular/Lymphatic: Aortic atherosclerosis. No enlarged abdominal or
pelvic lymph nodes.

Reproductive: Prostate is unremarkable.

Other: Tiny fat containing umbilical hernia.

Musculoskeletal: No acute or focal abnormality. Convex left lumbar
scoliosis and multilevel degenerative change noted.
IMPRESSION: Moderate to moderately severe left hydronephrosis due to a 0.6 cm
left ureteral stone at the level of the left hip. The patient has
multiple nonobstructing left renal stones measuring up to 1 cm.

Severe atrophy of the right kidney is likely due to chronic
obstruction due to 1 or more stones in the distal right ureter
measuring up to 1 cm in diameter.

Diverticulosis without diverticulitis.

Aortic Atherosclerosis ([id number].J).

These results will be called to the ordering clinician or
representative by the Radiologist Assistant, and communication
documented in the PACS or [REDACTED].

## 2022-09-04 ENCOUNTER — Ambulatory Visit: Payer: Medicare Other | Admitting: Urology

## 2022-09-05 ENCOUNTER — Ambulatory Visit: Payer: Medicare Other | Admitting: Urology

## 2022-09-05 ENCOUNTER — Ambulatory Visit
Admission: RE | Admit: 2022-09-05 | Discharge: 2022-09-05 | Disposition: A | Discharge status: Home / Self Care | Payer: Medicare Other | Attending: Urology | Admitting: Urology

## 2022-09-05 ENCOUNTER — Ambulatory Visit
Admission: RE | Admit: 2022-09-05 | Discharge: 2022-09-05 | Disposition: A | Discharge status: Home / Self Care | Payer: Medicare Other | Source: Ambulatory Visit | Attending: Urology | Admitting: Urology

## 2022-09-05 VITALS — BP 123/83 | HR 103 | Ht 70.0 in | Wt 195.0 lb

## 2022-09-05 DIAGNOSIS — Z87442 Personal history of urinary calculi: Secondary | ICD-10-CM | POA: Diagnosis present

## 2022-09-05 DIAGNOSIS — Z905 Acquired absence of kidney: Secondary | ICD-10-CM

## 2022-09-05 DIAGNOSIS — N1832 Chronic kidney disease, stage 3b: Secondary | ICD-10-CM

## 2022-09-05 NOTE — Patient Instructions (Signed)

## 2022-09-05 NOTE — Progress Notes (Signed)
   09/05/2022 8:47 AM   Andrew Benton 1946/01/22 [id number]  Reason for visit: Follow up solitary left kidney, chronic right hydronephrosis, nephrolithiasis, CKD   HPI: He is a 76 year old male with an essentially solitary left kidney, as he has a completely atrophic right kidney with severe right hydronephrosis secondary to a large right distal ureteral stone that has been present for at least 7+ years.  He previously presented in July 2021 with left ureteral stone and significant left renal stone burden, and underwent left ureteral stent placement followed by definitive left ureteroscopy, laser lithotripsy, and stent placement.  He underwent uncomplicated stent removal on 06/14/2020, and follow-up renal ultrasound showed no left hydronephrosis and stable chronic right hydronephrosis.   Stone type was calcium oxalate.  Most recent 24-hour urine results notable for excellent urine volume of 2.86 L, normal urine calcium of 143, normal urine oxalate of 21, increased citrate from prior of 425 from 394, though still on the low side, normal urine sodium of 128.  We again reviewed adequate fluid intake, high citrate food and beverages, and low-salt diet.   Renal function is stable with creatinine of 1.7(eGFR 41) from 1.8 last year.  PSA was checked by PCP in April 2023 and was normal at 0.24, can discontinue screening moving forward.  I personally reviewed and interpreted his KUB today that shows stable right distal ureteral stone, left lower pole 61m stone   He denies any gross hematuria, flank pain, or definite stone episodes since our last visit.  He does report some new lower extremity edema over the last few months that is being evaluated by his PCP and has an upcoming cardiology appointment.  He was started on furosemide which has helped over the last few weeks.  He denies any bothersome urinary symptoms.  -RTC 1 year KUB prior -Return precautions discussed extensively with his solitary left  kidney and history of stone disease   BBilley Co MD  [place] [place]  [phone]

## 2022-10-16 ENCOUNTER — Encounter: Payer: Self-pay | Admitting: Emergency Medicine

## 2022-10-16 ENCOUNTER — Inpatient Hospital Stay: Payer: Medicare Other

## 2022-10-16 ENCOUNTER — Other Ambulatory Visit: Payer: Self-pay

## 2022-10-16 ENCOUNTER — Emergency Department: Payer: Medicare Other

## 2022-10-16 ENCOUNTER — Inpatient Hospital Stay
Admission: EM | Admit: 2022-10-16 | Discharge: 2022-11-04 | DRG: 291 | Disposition: E | Payer: Medicare Other | Attending: Pulmonary Disease | Admitting: Pulmonary Disease

## 2022-10-16 DIAGNOSIS — E875 Hyperkalemia: Secondary | ICD-10-CM | POA: Diagnosis present

## 2022-10-16 DIAGNOSIS — N131 Hydronephrosis with ureteral stricture, not elsewhere classified: Secondary | ICD-10-CM | POA: Diagnosis present

## 2022-10-16 DIAGNOSIS — N1832 Chronic kidney disease, stage 3b: Secondary | ICD-10-CM | POA: Diagnosis present

## 2022-10-16 DIAGNOSIS — J449 Chronic obstructive pulmonary disease, unspecified: Secondary | ICD-10-CM | POA: Diagnosis present

## 2022-10-16 DIAGNOSIS — R570 Cardiogenic shock: Secondary | ICD-10-CM | POA: Diagnosis not present

## 2022-10-16 DIAGNOSIS — E785 Hyperlipidemia, unspecified: Secondary | ICD-10-CM | POA: Diagnosis present

## 2022-10-16 DIAGNOSIS — N179 Acute kidney failure, unspecified: Secondary | ICD-10-CM | POA: Diagnosis present

## 2022-10-16 DIAGNOSIS — F419 Anxiety disorder, unspecified: Secondary | ICD-10-CM | POA: Diagnosis present

## 2022-10-16 DIAGNOSIS — G9341 Metabolic encephalopathy: Secondary | ICD-10-CM | POA: Diagnosis not present

## 2022-10-16 DIAGNOSIS — I4901 Ventricular fibrillation: Secondary | ICD-10-CM | POA: Diagnosis not present

## 2022-10-16 DIAGNOSIS — D631 Anemia in chronic kidney disease: Secondary | ICD-10-CM | POA: Diagnosis present

## 2022-10-16 DIAGNOSIS — E669 Obesity, unspecified: Secondary | ICD-10-CM | POA: Diagnosis present

## 2022-10-16 DIAGNOSIS — I428 Other cardiomyopathies: Secondary | ICD-10-CM | POA: Diagnosis present

## 2022-10-16 DIAGNOSIS — G629 Polyneuropathy, unspecified: Secondary | ICD-10-CM | POA: Diagnosis present

## 2022-10-16 DIAGNOSIS — I5043 Acute on chronic combined systolic (congestive) and diastolic (congestive) heart failure: Secondary | ICD-10-CM | POA: Diagnosis present

## 2022-10-16 DIAGNOSIS — I9589 Other hypotension: Secondary | ICD-10-CM | POA: Diagnosis present

## 2022-10-16 DIAGNOSIS — M109 Gout, unspecified: Secondary | ICD-10-CM | POA: Diagnosis present

## 2022-10-16 DIAGNOSIS — N4889 Other specified disorders of penis: Secondary | ICD-10-CM | POA: Diagnosis not present

## 2022-10-16 DIAGNOSIS — Z66 Do not resuscitate: Secondary | ICD-10-CM | POA: Diagnosis not present

## 2022-10-16 DIAGNOSIS — Z7982 Long term (current) use of aspirin: Secondary | ICD-10-CM

## 2022-10-16 DIAGNOSIS — N201 Calculus of ureter: Secondary | ICD-10-CM | POA: Diagnosis present

## 2022-10-16 DIAGNOSIS — Z1152 Encounter for screening for COVID-19: Secondary | ICD-10-CM

## 2022-10-16 DIAGNOSIS — I081 Rheumatic disorders of both mitral and tricuspid valves: Secondary | ICD-10-CM | POA: Diagnosis present

## 2022-10-16 DIAGNOSIS — I480 Paroxysmal atrial fibrillation: Secondary | ICD-10-CM | POA: Diagnosis present

## 2022-10-16 DIAGNOSIS — Z888 Allergy status to other drugs, medicaments and biological substances status: Secondary | ICD-10-CM

## 2022-10-16 DIAGNOSIS — I959 Hypotension, unspecified: Secondary | ICD-10-CM | POA: Diagnosis not present

## 2022-10-16 DIAGNOSIS — I482 Chronic atrial fibrillation, unspecified: Secondary | ICD-10-CM | POA: Diagnosis present

## 2022-10-16 DIAGNOSIS — I5023 Acute on chronic systolic (congestive) heart failure: Secondary | ICD-10-CM | POA: Diagnosis not present

## 2022-10-16 DIAGNOSIS — G250 Essential tremor: Secondary | ICD-10-CM | POA: Diagnosis present

## 2022-10-16 DIAGNOSIS — I130 Hypertensive heart and chronic kidney disease with heart failure and stage 1 through stage 4 chronic kidney disease, or unspecified chronic kidney disease: Principal | ICD-10-CM | POA: Diagnosis present

## 2022-10-16 DIAGNOSIS — R0682 Tachypnea, not elsewhere classified: Secondary | ICD-10-CM | POA: Diagnosis not present

## 2022-10-16 DIAGNOSIS — I10 Essential (primary) hypertension: Secondary | ICD-10-CM

## 2022-10-16 DIAGNOSIS — Z6830 Body mass index (BMI) 30.0-30.9, adult: Secondary | ICD-10-CM

## 2022-10-16 DIAGNOSIS — J90 Pleural effusion, not elsewhere classified: Secondary | ICD-10-CM

## 2022-10-16 DIAGNOSIS — R0902 Hypoxemia: Secondary | ICD-10-CM | POA: Diagnosis not present

## 2022-10-16 DIAGNOSIS — Z87442 Personal history of urinary calculi: Secondary | ICD-10-CM

## 2022-10-16 DIAGNOSIS — Z79899 Other long term (current) drug therapy: Secondary | ICD-10-CM

## 2022-10-16 DIAGNOSIS — E66811 Obesity, class 1: Secondary | ICD-10-CM | POA: Diagnosis present

## 2022-10-16 LAB — COMPREHENSIVE METABOLIC PANEL
ALT: 37 U/L (ref 0–44)
AST: 44 U/L — ABNORMAL HIGH (ref 15–41)
Albumin: 3.5 g/dL (ref 3.5–5.0)
Alkaline Phosphatase: 190 U/L — ABNORMAL HIGH (ref 38–126)
Anion gap: 6 (ref 5–15)
BUN: 96 mg/dL — ABNORMAL HIGH (ref 8–23)
CO2: 23 mmol/L (ref 22–32)
Calcium: 9.1 mg/dL (ref 8.9–10.3)
Chloride: 111 mmol/L (ref 98–111)
Creatinine, Ser: 2.96 mg/dL — ABNORMAL HIGH (ref 0.61–1.24)
GFR, Estimated: 21 mL/min — ABNORMAL LOW (ref >60)
Glucose, Bld: 115 mg/dL — ABNORMAL HIGH (ref 70–99)
Potassium: 5.7 mmol/L — ABNORMAL HIGH (ref 3.5–5.1)
Sodium: 140 mmol/L (ref 135–145)
Total Bilirubin: 1.4 mg/dL — ABNORMAL HIGH (ref 0.3–1.2)
Total Protein: 7.8 g/dL (ref 6.5–8.1)

## 2022-10-16 LAB — RESP PANEL BY RT-PCR (RSV, FLU A&B, COVID)  RVPGX2
Influenza A by PCR: NEGATIVE
Influenza B by PCR: NEGATIVE
Resp Syncytial Virus by PCR: NEGATIVE
SARS Coronavirus 2 by RT PCR: NEGATIVE

## 2022-10-16 LAB — CBC WITH DIFFERENTIAL/PLATELET
Abs Immature Granulocytes: 0.05 10*3/uL (ref 0.00–0.07)
Basophils Absolute: 0.1 10*3/uL (ref 0.0–0.1)
Basophils Relative: 1 %
Eosinophils Absolute: 0.1 10*3/uL (ref 0.0–0.5)
Eosinophils Relative: 1 %
HCT: 40.5 % (ref 39.0–52.0)
Hemoglobin: 12.9 g/dL — ABNORMAL LOW (ref 13.0–17.0)
Immature Granulocytes: 1 %
Lymphocytes Relative: 13 %
Lymphs Abs: 1 10*3/uL (ref 0.7–4.0)
MCH: 31.3 pg (ref 26.0–34.0)
MCHC: 31.9 g/dL (ref 30.0–36.0)
MCV: 98.3 fL (ref 80.0–100.0)
Monocytes Absolute: 1.1 10*3/uL — ABNORMAL HIGH (ref 0.1–1.0)
Monocytes Relative: 15 %
Neutro Abs: 5.3 10*3/uL (ref 1.7–7.7)
Neutrophils Relative %: 69 %
Platelets: 252 10*3/uL (ref 150–400)
RBC: 4.12 MIL/uL — ABNORMAL LOW (ref 4.22–5.81)
RDW: 16.2 % — ABNORMAL HIGH (ref 11.5–15.5)
WBC: 7.5 10*3/uL (ref 4.0–10.5)
nRBC: 0 % (ref 0.0–0.2)

## 2022-10-16 LAB — URINALYSIS, COMPLETE (UACMP) WITH MICROSCOPIC
Bilirubin Urine: NEGATIVE
Glucose, UA: NEGATIVE mg/dL
Hgb urine dipstick: NEGATIVE
Ketones, ur: NEGATIVE mg/dL
Leukocytes,Ua: NEGATIVE
Nitrite: NEGATIVE
Protein, ur: NEGATIVE mg/dL
Specific Gravity, Urine: 1.009 (ref 1.005–1.030)
WBC, UA: NONE SEEN WBC/hpf (ref 0–5)
pH: 5 (ref 5.0–8.0)

## 2022-10-16 LAB — POTASSIUM
Potassium: 5.4 mmol/L — ABNORMAL HIGH (ref 3.5–5.1)
Potassium: 5.1 mmol/L (ref 3.5–5.1)
Potassium: 4.8 mmol/L (ref 3.5–5.1)

## 2022-10-16 LAB — PROCALCITONIN: Procalcitonin: 0.12 ng/mL

## 2022-10-16 LAB — PROTIME-INR
INR: 1.1 (ref 0.8–1.2)
Prothrombin Time: 14.5 seconds (ref 11.4–15.2)

## 2022-10-16 LAB — TROPONIN I (HIGH SENSITIVITY)
Troponin I (High Sensitivity): 11 ng/L (ref <18)
Troponin I (High Sensitivity): 10 ng/L (ref <18)

## 2022-10-16 LAB — APTT: aPTT: 32 seconds (ref 24–36)

## 2022-10-16 LAB — HEPARIN LEVEL (UNFRACTIONATED): Heparin Unfractionated: 0.91 IU/mL — ABNORMAL HIGH (ref 0.30–0.70)

## 2022-10-16 LAB — BRAIN NATRIURETIC PEPTIDE: B Natriuretic Peptide: 494.2 pg/mL — ABNORMAL HIGH (ref 0.0–100.0)

## 2022-10-16 MED ORDER — ASPIRIN 81 MG PO TBEC
81.0000 mg | DELAYED_RELEASE_TABLET | Freq: Every day | ORAL | Status: DC
  Administered 2022-10-17 – 2022-10-18 (×2): 81 mg via ORAL
  Filled 2022-10-16 (×2): qty 1

## 2022-10-16 MED ORDER — VITAMIN B-12 1000 MCG PO TABS
1000.0000 ug | ORAL_TABLET | Freq: Every day | ORAL | Status: DC
  Administered 2022-10-17 – 2022-10-19 (×3): 1000 ug via ORAL
  Filled 2022-10-16 (×2): qty 1
  Filled 2022-10-16: qty 2

## 2022-10-16 MED ORDER — ACETAMINOPHEN 325 MG PO TABS
650.0000 mg | ORAL_TABLET | Freq: Four times a day (QID) | ORAL | Status: DC | PRN
  Administered 2022-10-17: 650 mg via ORAL
  Filled 2022-10-16: qty 2

## 2022-10-16 MED ORDER — ATORVASTATIN CALCIUM 10 MG PO TABS
10.0000 mg | ORAL_TABLET | Freq: Every evening | ORAL | Status: DC
  Administered 2022-10-17 – 2022-10-18 (×2): 10 mg via ORAL
  Filled 2022-10-16 (×2): qty 1

## 2022-10-16 MED ORDER — ENOXAPARIN SODIUM 30 MG/0.3ML IJ SOSY
30.0000 mg | PREFILLED_SYRINGE | INTRAMUSCULAR | Status: DC
  Administered 2022-10-16: 30 mg via SUBCUTANEOUS
  Filled 2022-10-16: qty 0.3

## 2022-10-16 MED ORDER — ONDANSETRON HCL 4 MG/2ML IJ SOLN: 4.0000 mg | Freq: Three times a day (TID) | INTRAMUSCULAR | Status: DC | PRN

## 2022-10-16 MED ORDER — PROPRANOLOL HCL ER 60 MG PO CP24
60.0000 mg | ORAL_CAPSULE | Freq: Every day | ORAL | Status: DC
  Filled 2022-10-16 (×2): qty 1

## 2022-10-16 MED ORDER — FUROSEMIDE 10 MG/ML IJ SOLN
4.0000 mg/h | INTRAVENOUS | Status: DC
  Administered 2022-10-16: 4 mg/h via INTRAVENOUS
  Filled 2022-10-16 (×2): qty 20

## 2022-10-16 MED ORDER — ALBUTEROL SULFATE (2.5 MG/3ML) 0.083% IN NEBU
2.5000 mg | INHALATION_SOLUTION | RESPIRATORY_TRACT | Status: DC | PRN
  Filled 2022-10-16: qty 3

## 2022-10-16 MED ORDER — FUROSEMIDE 10 MG/ML IJ SOLN
40.0000 mg | Freq: Once | INTRAMUSCULAR | Status: AC
  Administered 2022-10-16: 40 mg via INTRAVENOUS
  Filled 2022-10-16: qty 4

## 2022-10-16 MED ORDER — GABAPENTIN 300 MG PO CAPS
300.0000 mg | ORAL_CAPSULE | Freq: Two times a day (BID) | ORAL | Status: DC
  Administered 2022-10-16 – 2022-10-18 (×5): 300 mg via ORAL
  Filled 2022-10-16 (×5): qty 1

## 2022-10-16 MED ORDER — HEPARIN (PORCINE) 25000 UT/250ML-% IV SOLN
900.0000 [IU]/h | INTRAVENOUS | Status: DC
  Administered 2022-10-16: 1300 [IU]/h via INTRAVENOUS
  Filled 2022-10-16 (×2): qty 250

## 2022-10-16 MED ORDER — ADULT MULTIVITAMIN W/MINERALS CH
1.0000 | ORAL_TABLET | Freq: Every day | ORAL | Status: DC
  Administered 2022-10-17 – 2022-10-19 (×3): 1 via ORAL
  Filled 2022-10-16 (×3): qty 1

## 2022-10-16 MED ORDER — SODIUM ZIRCONIUM CYCLOSILICATE 10 G PO PACK
10.0000 g | PACK | Freq: Once | ORAL | Status: DC
  Filled 2022-10-16: qty 1

## 2022-10-16 MED ORDER — HEPARIN BOLUS VIA INFUSION
4000.0000 [IU] | Freq: Once | INTRAVENOUS | Status: AC
  Administered 2022-10-16: 4000 [IU] via INTRAVENOUS
  Filled 2022-10-16: qty 4000

## 2022-10-16 MED ORDER — HYDRALAZINE HCL 20 MG/ML IJ SOLN: 5.0000 mg | INTRAMUSCULAR | Status: DC | PRN

## 2022-10-16 MED ORDER — DM-GUAIFENESIN ER 30-600 MG PO TB12: 1.0000 | ORAL_TABLET | Freq: Two times a day (BID) | ORAL | Status: DC | PRN

## 2022-10-16 MED ORDER — FUROSEMIDE 10 MG/ML IJ SOLN: 40.0000 mg | Freq: Two times a day (BID) | INTRAMUSCULAR | Status: DC

## 2022-10-16 MED ORDER — ALLOPURINOL 100 MG PO TABS
100.0000 mg | ORAL_TABLET | Freq: Two times a day (BID) | ORAL | Status: DC
  Administered 2022-10-17 – 2022-10-19 (×4): 100 mg via ORAL
  Filled 2022-10-16 (×6): qty 1

## 2022-10-16 MED ORDER — SODIUM ZIRCONIUM CYCLOSILICATE 5 G PO PACK
5.0000 g | PACK | Freq: Once | ORAL | Status: AC
  Administered 2022-10-16: 5 g via ORAL
  Filled 2022-10-16: qty 1

## 2022-10-16 NOTE — Progress Notes (Signed)
PHARMACIST - PHYSICIAN COMMUNICATION  CONCERNING:  Enoxaparin (Lovenox) for DVT Prophylaxis    RECOMMENDATION: Patient was prescribed enoxaprin 40mg  q24 hours for VTE prophylaxis.   Filed Weights   Oct 26, 2022 0628  Weight: 95.3 kg (210 lb)    Body mass index is 30.13 kg/m.  Estimated Creatinine Clearance: 24.2 mL/min (A) (by C-G formula based on SCr of 2.96 mg/dL (H)).   Patient is candidate for enoxaparin 30mg  every 24 hours based on CrCl <45ml/min or Weight <45kg  DESCRIPTION: Pharmacy has adjusted enoxaparin dose per [facility] policy.  Patient is now receiving enoxaparin 30 mg every 24 hours    31m, PharmD Clinical Pharmacist  2022/10/26 8:52 AM

## 2022-10-16 NOTE — ED Triage Notes (Signed)
Pt to triage via w/c with no distress noted; pt reports "for awhile" having SHOB; has appt with [facility] cardio tomorrow but having increasing SHOB with exertion, lying supine; also swelling to legs/abd/genitals

## 2022-10-16 NOTE — Consult Note (Incomplete)
ANTICOAGULATION CONSULT NOTE  Pharmacy Consult for heparin drip Indication: atrial fibrillation  Allergies  Allergen Reactions   Primidone     Other reaction(s): Other (See Comments)    Patient Measurements: Height: 5\' 10"  (177.8 cm) Weight: 95.3 kg (210 lb) IBW/kg (Calculated) : 73 Heparin Dosing Weight: 92.5 kg  Vital Signs: Temp: 97.9 F (36.6 C) (12/13 1054) Temp Source: Oral (12/13 1054) BP: 95/78 (12/13 1100) Pulse Rate: 78 (12/13 1100)  Labs: Recent Labs    10/27/2022 0656 10/26/2022 0832 11/02/2022 1150  HGB 12.9*  --   --   HCT 40.5  --   --   PLT 252  --   --   APTT  --   --  32  LABPROT  --   --  14.5  INR  --   --  1.1  CREATININE 2.96*  --   --   TROPONINIHS 11 10  --      Estimated Creatinine Clearance: 24.2 mL/min (A) (by C-G formula based on SCr of 2.96 mg/dL (H)).   Medical History: Past Medical History:  Diagnosis Date   Anemia    Arthritis    a little bit in hands    Hypertension    Kidney stones     Medications:  No home anticoagulants per pharmacist review  Assessment: 76 yo patient presented to ED with increasing SOB over last 1 to 2 months, especially with exertion.  Has PMH of CHF, CKD, HTN.  During work-up was found to be in Atrial fibrillation.  Pharmacy consulted to initiate heparin drip.  Baseline aPTT 32s and INR 1.1    Goal of Therapy:  Heparin level 0.3-0.7 units/ml Monitor platelets by anticoagulation protocol: Yes   Plan:  Give 4000 units bolus x 1 Start heparin infusion at 1300 units/hr Check anti-Xa level in 8 hours and daily while on heparin Continue to monitor H&H and platelets  62, PharmD 10/04/2022,2:31 PM

## 2022-10-16 NOTE — Progress Notes (Signed)
Lab called for heparin draw

## 2022-10-16 NOTE — Consult Note (Signed)
ANTICOAGULATION CONSULT NOTE  Pharmacy Consult for heparin drip Indication: atrial fibrillation  Allergies  Allergen Reactions   Primidone     Other reaction(s): Other (See Comments)    Patient Measurements: Height: 5\' 10"  (177.8 cm) Weight: 95.3 kg (210 lb) IBW/kg (Calculated) : 73 Heparin Dosing Weight: 92.5 kg  Vital Signs: Temp: 97.9 F (36.6 C) (12/13 1054) Temp Source: Oral (12/13 1054) BP: 92/81 (12/13 1054) Pulse Rate: 90 (12/13 1054)  Labs: Recent Labs    November 08, 2022 0656 November 08, 2022 0832  HGB 12.9*  --   HCT 40.5  --   PLT 252  --   CREATININE 2.96*  --   TROPONINIHS 11 10    Estimated Creatinine Clearance: 24.2 mL/min (A) (by C-G formula based on SCr of 2.96 mg/dL (H)).   Medical History: Past Medical History:  Diagnosis Date   Anemia    Arthritis    a little bit in hands    Hypertension    Kidney stones     Medications:  No home anticoagulants per pharmacist review  Assessment: 76 yo patient presented to ED with increasing SOB over last 1 to 2 months, especially with exertion.  Has PMH of CHF, CKD, HTN.  During work-up was found to be in Atrial fibrillation.  Pharmacy consulted to initiate heparin drip.  Baseline aPTT and PT/INR pending.  Hgb 12.9, plt = 252.   Goal of Therapy:  Heparin level 0.3-0.7 units/ml Monitor platelets by anticoagulation protocol: Yes   Plan:  Give 4000 units bolus x 1 Start heparin infusion at 1300 units/hr Check anti-Xa level in 8 hours and daily while on heparin Continue to monitor H&H and platelets  62, PharmD 11/08/22,11:23 AM

## 2022-10-16 NOTE — ED Notes (Signed)
Dr Niu at bedside 

## 2022-10-16 NOTE — ED Provider Notes (Signed)
[facility] Provider Note    Event Date/Time   First MD Initiated Contact with Patient 10/18/2022 [phone]     (approximate)   History   Chief Complaint Shortness of Breath   HPI  Andrew Benton is a 76 y.o. male with past medical history of hypertension, nonischemic cardiomyopathy, CHF, CKD, anemia, and solitary left kidney who presents to the ED complaining of shortness of breath.  Patient reports that over the past 1 to 2 months he has been dealing with increasing difficulty breathing, especially with exertion.  He states that it got to the point where he gets out of breath leaning over to tie his shoes.  He has also noticed increase swelling starting in both legs and now extending up into his groin.  He has started to notice the weeping of clear fluid from a spot that his right leg, denies any fevers, redness, or purulent drainage.  He has been following with critical cardiology for this problem and had stress testing as well as echocardiogram performed with plan for follow-up tomorrow, but had significant difficulty breathing when laying flat last night and decided to seek care in the ED.  He has had a dry cough but denies any fevers.  Has had occasional burning pain in his chest with exertion but denies any pain currently.     Physical Exam   Triage Vital Signs: ED Triage Vitals  Enc Vitals Group     BP 10/11/2022 0638 130/82     Pulse Rate 10/15/2022 0638 91     Resp 10/29/2022 0638 20     Temp 10/04/2022 0638 97.7 F (36.5 C)     Temp src --      SpO2 10/08/2022 0638 97 %     Weight 10/27/2022 0628 210 lb (95.3 kg)     Height 10/26/2022 0628 5\' 10"  (1.778 m)     Head Circumference --      Peak Flow --      Pain Score 10/06/2022 0628 0     Pain Loc --      Pain Edu? --      Excl. in GC? --     Most recent vital signs: Vitals:   10/10/2022 0638  BP: 130/82  Pulse: 91  Resp: 20  Temp: 97.7 F (36.5 C)  SpO2: 97%    Constitutional: Alert and  oriented. Eyes: Conjunctivae are normal. Head: Atraumatic. Nose: No congestion/rhinnorhea. Mouth/Throat: Mucous membranes are moist.  Cardiovascular: Normal rate, regular rhythm. Grossly normal heart sounds.  2+ radial pulses bilaterally. Respiratory: Normal respiratory effort.  No retractions. Lungs with fine crackles bilaterally, diminished at left base. Gastrointestinal: Soft and nontender. No distention. Musculoskeletal: 2+ pitting edema throughout both legs and extending into groin area, clear fluid weeping from small area to right lower leg with no erythema or warmth. Neurologic:  Normal speech and language. No gross focal neurologic deficits are appreciated.    ED Results / Procedures / Treatments   Labs (all labs ordered are listed, but only abnormal results are displayed) Labs Reviewed  CBC WITH DIFFERENTIAL/PLATELET - Abnormal; Notable for the following components:      Result Value   RBC 4.12 (*)    Hemoglobin 12.9 (*)    RDW 16.2 (*)    Monocytes Absolute 1.1 (*)    All other components within normal limits  COMPREHENSIVE METABOLIC PANEL - Abnormal; Notable for the following components:   Potassium 5.7 (*)    Glucose, Bld 115 (*)  BUN 96 (*)    Creatinine, Ser 2.96 (*)    AST 44 (*)    Alkaline Phosphatase 190 (*)    Total Bilirubin 1.4 (*)    GFR, Estimated 21 (*)    All other components within normal limits  BRAIN NATRIURETIC PEPTIDE - Abnormal; Notable for the following components:   B Natriuretic Peptide 494.2 (*)    All other components within normal limits  PROCALCITONIN  TROPONIN I (HIGH SENSITIVITY)  TROPONIN I (HIGH SENSITIVITY)     EKG  ED ECG REPORT I, Chesley Noon, the attending physician, personally viewed and interpreted this ECG.   Date: 10-25-2022  EKG Time: 6:32  Rate: 87  Rhythm: atrial fibrillation  Axis: RAD  Intervals:none  ST&T Change: None  RADIOLOGY Chest x-ray reviewed and interpreted by me with moderate effusion on  the left, no focal infiltrate noted.  PROCEDURES:  Critical Care performed: No  Procedures   MEDICATIONS ORDERED IN ED: Medications  furosemide (LASIX) injection 40 mg (has no administration in time range)     IMPRESSION / MDM / ASSESSMENT AND PLAN / ED COURSE  I reviewed the triage vital signs and the nursing notes.                              76 y.o. male with past medical history of hypertension, nonischemic cardiomyopathy, CHF, CKD, and solitary left kidney who presents to the ED complaining of increasing dyspnea on exertion along with lower extremity swelling over the past 2 months.  Patient's presentation is most consistent with acute presentation with potential threat to life or bodily function.  Differential diagnosis includes, but is not limited to, CHF, COPD, pneumonia, ACS, PE, electrode abnormality, AKI, anemia.  Patient well-appearing and in no acute distress, vital signs are unremarkable.  He reports significant dyspnea on exertion but has minimal dyspnea at rest or if he is sitting upright.  EKG shows possible A-fib with controlled rate, no ischemic changes noted and troponin within normal limits, low suspicion for ACS or PE.  Patient does appear significantly fluid overloaded with left-sided effusion noted on chest x-ray, likely secondary to CHF exacerbation.  Lower suspicion for parapneumonic effusion as patient has nonproductive cough with no fever or leukocytosis.  We will add on procalcitonin but hold off on antibiotics for now.  Plan to diurese with IV Lasix, however patient would benefit from admission given AKI on top of his baseline creatinine of around 1.8.  He will require careful diuresis.  Labs also remarkable for mild hypokalemia, plan to trend following IV Lasix.  No significant anemia or leukocytosis noted, BNP mildly elevated consistent with CHF.  Case discussed with hospitalist for admission.      FINAL CLINICAL IMPRESSION(S) / ED DIAGNOSES   Final  diagnoses:  Acute on chronic combined systolic and diastolic congestive heart failure (HCC)  Pleural effusion  AKI (acute kidney injury) (HCC)     Rx / DC Orders   ED Discharge Orders     None        Note:  This document was prepared using Dragon voice recognition software and may include unintentional dictation errors.   Chesley Noon, MD 10/25/22 [phone]

## 2022-10-16 NOTE — Consult Note (Signed)
[facility] CARDIOLOGY CONSULT NOTE       Patient ID: Andrew Benton MRN: [id number] DOB/AGE: [birth year] 76 y.o.  Admit date: 2022/10/25 Referring Physician Dr. Clyde Lundborg Primary Physician Dr. Marcelino Duster Primary Cardiologist Dr. Gwen Pounds / Minda Ditto, PA-C Reason for Consultation AoCHF  HPI: Andrew Benton is a [phone] with a PMH of NICM (35-40%, mod LV & RV dysfunction, mod TR 09/23/22), atrophic R kidney, CKD 3b, chronic anemia, HTN, HLD, essential tremor who presented to [facility] ED 2022-10-25 with dyspnea on exertion over the past 2 months, with significant peripheral edema, orthopnea, acutely worsening last night.  EKG on admission shows rate controlled atrial fibrillation. Cardiology is consulted for assistance with his heart failure.  Patient presents with his son who contributes to the history.  Over the past several months he has had increased dyspnea on exertion, limiting his ability to ambulate normally without stopping to rest.  He was started on 20 mg of p.o. Lasix once a day within the past month or so in an attempt to help this dyspnea, and also had a treadmill Myoview, and repeat echocardiogram with his regular cardiologist on 11/20.  His EKG during the Myoview was abnormal due to baseline atrial fibrillation, but did not show evidence of infarct or ischemia.  Ejection fraction was 41%.  Echocardiogram showed a slight reduction in his EF at 35-40%, compared to 40% in 04/2021, but with moderately reduced biventricular function with biventricular enlargement.  Over the past 1 or 2 weeks he has had a significant increase in his dyspnea on exertion, says he could not walk 50 feet or so without getting significantly short of breath, had significant orthopnea last night, and progressive peripheral edema from his ankles, to his genitals, and recently up into his abdomen.  He has noticed significant weight gain, on 11/6 at his cardiology visit he weighed 87.5 kg (193 pounds) and today he  weighed in at 95.3 kg (209lbs).  Denies chest pain, heart racing or palpitations, PND.  At my time of evaluation he has not noticed much change in his peripheral edema or how he is feeling in general after 1 dose of IV Lasix.  He denies any decreased urine output, despite his worsening renal function.  Vitals are notable for relative hypotension with blood pressure 99/75, he is in atrial fibrillation with controlled rate on telemetry between 70 and 90 bpm.  Saturating 100% on room air.  Labs are notable for hyperkalemia that is improving after Lasix administration from 5.7-5.4, BUN/creatinine 96/2.96 and GFR 21, baseline from this year's creatinine 1.7 and GFR 37-40.  BNP elevated at 492, high-sensitivity troponin negative at 11-10.  H&H 12.9/40 platelets 252.  Chest x-ray with a left >right pleural effusion.  Review of systems complete and found to be negative unless listed above     Past Medical History:  Diagnosis Date   Anemia    Arthritis    a little bit in hands    Hypertension    Kidney stones     Past Surgical History:  Procedure Laterality Date   COLONOSCOPY  2011   CYSTOSCOPY W/ RETROGRADES  05/19/2020   Procedure: CYSTOSCOPY WITH RETROGRADE PYELOGRAM;  Surgeon: Sondra Come, MD;  Location: [facility] ORS;  Service: Urology;;   CYSTOSCOPY WITH STENT PLACEMENT Left 05/19/2020   Procedure: CYSTOSCOPY WITH STENT PLACEMENT;  Surgeon: Sondra Come, MD;  Location: [facility] ORS;  Service: Urology;  Laterality: Left;   CYSTOSCOPY/URETEROSCOPY/HOLMIUM LASER/STENT PLACEMENT Left 06/02/2020   Procedure: CYSTOSCOPY/URETEROSCOPY/HOLMIUM LASER/STENT  PLACEMENT;  Surgeon: Sondra Come, MD;  Location: [facility] ORS;  Service: Urology;  Laterality: Left;   EYE SURGERY  2016   TONSILLECTOMY      (Not in a hospital admission)  Social History   Socioeconomic History   Marital status: Widowed    Spouse name: Not on file   Number of children: Not on file   Years of education: Not on file   Highest  education level: Not on file  Occupational History   Not on file  Tobacco Use   Smoking status: Never   Smokeless tobacco: Never  Vaping Use   Vaping Use: Never used  Substance and Sexual Activity   Alcohol use: Not Currently   Drug use: Never   Sexual activity: Yes    Birth control/protection: None  Other Topics Concern   Not on file  Social History Narrative   Not on file   Social Determinants of Health   Financial Resource Strain: Not on file  Food Insecurity: Not on file  Transportation Needs: Not on file  Physical Activity: Not on file  Stress: Not on file  Social Connections: Not on file  Intimate Partner Violence: Not on file    History reviewed. No pertinent family history.   No intake or output data in the 24 hours ending 2022-11-09 0922  Vitals:   09-Nov-2022 0628 09-Nov-2022 0638 11-09-2022 0840  BP:  130/82 103/85  Pulse:  91 86  Resp:  20   Temp:  97.7 F (36.5 C)   SpO2:  97% 96%  Weight: 95.3 kg    Height: 5\' 10"  (1.778 m)      PHYSICAL EXAM General: Pleasant elderly Caucasian male, well nourished, in no acute distress. HEENT:  Normocephalic and atraumatic. Neck:  No JVD.  Lungs: Slight conversational dyspnea on room air absent bibasilar breath sounds, no appreciable wheezes. heart: Irregularly irregular with controlled rate. Normal S1 and S2 without gallops or murmurs.  Abdomen: Mildly distended appearing, soft and nontender Msk: Normal strength and tone for age. Extremities:  No clubbing, cyanosis.  Significant 2-3+ pitting edema from his ankles, up to his thighs..  Neuro: Alert and oriented X 3. Psych:  Answers questions appropriately.   Labs: Basic Metabolic Panel: Recent Labs    2022/11/09 0656  NA 140  K 5.7*  CL 111  CO2 23  GLUCOSE 115*  BUN 96*  CREATININE 2.96*  CALCIUM 9.1   Liver Function Tests: Recent Labs    2022-11-09 0656  AST 44*  ALT 37  ALKPHOS 190*  BILITOT 1.4*  PROT 7.8  ALBUMIN 3.5   No results for input(s):  "LIPASE", "AMYLASE" in the last 72 hours. CBC: Recent Labs    11/09/2022 0656  WBC 7.5  NEUTROABS 5.3  HGB 12.9*  HCT 40.5  MCV 98.3  PLT 252   Cardiac Enzymes: Recent Labs    November 09, 2022 0656 November 09, 2022 0832  TROPONINIHS 11 10   BNP: Recent Labs    Nov 09, 2022 0656  BNP 494.2*   D-Dimer: No results for input(s): "DDIMER" in the last 72 hours. Hemoglobin A1C: No results for input(s): "HGBA1C" in the last 72 hours. Fasting Lipid Panel: No results for input(s): "CHOL", "HDL", "LDLCALC", "TRIG", "CHOLHDL", "LDLDIRECT" in the last 72 hours. Thyroid Function Tests: No results for input(s): "TSH", "T4TOTAL", "T3FREE", "THYROIDAB" in the last 72 hours.  Invalid input(s): "FREET3" Anemia Panel: No results for input(s): "VITAMINB12", "FOLATE", "FERRITIN", "TIBC", "IRON", "RETICCTPCT" in the last 72 hours.   Radiology: DG  Chest 2 View  Result Date: 10/22/22 CLINICAL DATA:  Chest pain EXAM: CHEST - 2 VIEW COMPARISON:  05/18/2020 abdominal CT FINDINGS: Pleural effusions, borderline moderate on the left. Pleural fluid obscures the lung bases. No edema or pneumothorax. Possible cardiomegaly. No acute osseous finding. IMPRESSION: Left more than right pleural effusion obscuring the lower lungs. Electronically Signed   By: Tiburcio Pea M.D.   On: 10/22/2022 06:47    Treadmill Myoview 09/23/22  Impression  Indeterminant treadmill EKG due to baseline EKG changes and atrial fibrillation Normal myocardial perfusion without evidence of myocardial ischemia  Arnoldo Hooker Narrative  Table formatting from the original result was not included. CARDIOLOGY DEPARTMENT [facility] [address] [place], [place]  [address] [phone]  Procedure: Exercise Myocardial Perfusion Imaging   ONE day procedure  Indication: Cardiomyopathy, nonischemic (CMS-HCC) Plan: NM myocardial perfusion SPECT multiple (stress       and rest), ECG stress test only  SOBOE  (shortness of breath on exertion) Plan: NM myocardial perfusion SPECT multiple (stress       and rest), ECG stress test only  Bilateral leg edema Plan: NM myocardial perfusion SPECT multiple (stress       and rest), ECG stress test only  Ordering Physician:  Dr. Arnoldo Hooker  Clinical History: 76 y.o. year old male Vitals: Height: 70 in  Weight: 193 lb Cardiac risk factors include:    CARDIOMYOPATHY, Hyperlipidemia, and HTN  Procedure: The patient performed treadmill exercise using a Bruce protocol for 3:00 minutes. The exercise test was stopped due to fatigue.  Blood pressure response was  MIXED . The patient developed symptoms other than fatigue during the procedure; specific symptoms included SEVERE MALAISE AND DYSPNEA  Rest HR: 90bpm Rest BP: 142/26mmHg Max HR: 137bpm Max BP: 128/21mmHg Mets:     4.60 % MAX HR:   95%  Stress Test Administered by: Percell Boston, CMA  ECG Interpretation: Rest ECG:  atrial fibrillation, none Stress ECG:  atrial fibrillation,   Recovery ECG:  atrial fibrillation ECG Interpretation:  negative, no ECG changes.  Gated post-stress perfusion imaging was performed 30 minutes after stress. Rest images were performed 30 minutes after injection.  Gated LV Analysis:  Summary of LV Perfusion: Normal  Summary of LV Function: Normal  TID:  1.03  LVEF= 41%  ECHO 09/23/2022 MODERATE LV SYSTOLIC DYSFUNCTION (See above)  MODERATE RV SYSTOLIC DYSFUNCTION (See above)  NO VALVULAR STENOSIS  LARGE PLEURAL EFFUSION  MILD PERICARDIUM EFFUSION  MODERATE TR  MILD MR  TRIVIAL PR  EF 35-40%  Electronically signed by      MD Arnoldo Hooker on 09/23/2022 11: 01 AM   TELEMETRY reviewed by me (LT) 2022-10-22 : AF rate 70s-90s  EKG reviewed by me: AF low voltage rate 87, nonspecific ST or T wave changes  Data reviewed by me (LT) 2022-10-22: Last cardiology note, ED note CBC BMP BNP troponins chest x-ray vitals telemetry  Principal Problem:    Acute on chronic systolic CHF (congestive heart failure) (HCC) Active Problems:   Benign essential hypertension   Stage 3b chronic kidney disease (HCC)   Hyperkalemia   HLD (hyperlipidemia)   Atrial fibrillation, chronic (HCC)   Obesity (BMI 30.0-34.9)    ASSESSMENT AND PLAN:  Andrew Benton is a 83yoM with a PMH of NICM (35-40%, mod LV & RV dysfunction, mod TR 09/23/22), atrophic R kidney, CKD 3b, chronic anemia, HTN, HLD, essential tremor who presented to [facility] ED 10-22-22 with dyspnea on exertion  over the past 2 months, with significant peripheral edema, orthopnea, acutely worsening last night.  EKG on admission shows rate controlled atrial fibrillation. Cardiology is consulted for assistance with his heart failure.  # Anasarca # Acute on chronic HFrEF (EF 35-40%) # Pleural effusions Reports a several month history of worsening dyspnea on exertion, peripheral edema, refractory to p.o. Lasix 20 mg daily that was started 1 to 2 months ago.  Over the past 1 to 2 weeks he has had significant limitation in his daily life due to his dyspnea on exertion and progressively worsening anasarca (swelling up into his groin and abdomen) with a 16lb weight gain in the last month alone (95.3kg on admission).  Recent treadmill Myoview without ischemia, but was in atrial fibrillation during this test, but has not yet been addressed to the patient's knowledge (was planned for outpatient follow up re these tests on 12/14).  BNP 500 on admission. -S/p IV Lasix 40 mg x1 without much clinical improvement. -Plan to start Lasix infusion at 4 mg/h today. -Monitor and correct electrolytes as indicated, expect K+ (5.7 - 5.4) to resolve with Lasix administration. -Consider therapeutic thoracentesis later in admission  -Hold off on home propranolol LA 60 mg once daily and lisinopril 10 mg daily for now with relative hypotension and AKI -Strict I's/O, daily weights, salt and fluid restriction -Defer repeat  echocardiogram complete  # AKI on CKD 3 # hx atrophic R kidney  Baseline creatinine over the past year is 1.7-1.8 with GFR ranged from 37-40.  On admission with significant AKI with BUN/creatinine 96/2.96 and GFR of 21. -Consider early nephrology involvement, especially with his history of R kidney atrophy  # Paroxysmal atrial fibrillation with controlled rate Initially documented during treadmill Myoview on 09/23/22, no hx atrial fibrillation prior to this to the patient's knowledge.  On admission he was in AF with controlled rate around 80 bpm.  Query if this is in any way contributory to his cardiomyopathy. -Continuous telemetry monitoring while inpatient -Plan for rate control strategy for now, with consideration for pharmacologic rhythm control strategy before discharge and DCCV after appropriate AC (4wks)  -Add back propranolol LA as blood pressure allows (currently 90s/70s) -Discussed the risk and benefits of starting anticoagulation for stroke risk reduction. CHA2DS2-VASc of 4 (age, CHF, HTN).  He is agreeable to start a DOAC at discharge.  Will plan for heparin infusion while inpatient with his AKI.  This patient's plan of care was discussed and created with Dr. Juliann Pares and he is in agreement.  Signed: Rebeca Allegra , PA-C October 28, 2022, 9:22 AM [facility] Cardiology

## 2022-10-16 NOTE — H&P (Signed)
History and Physical    Andrew Benton QBH:[id number] DOB: [birth year] DOA: 10/23/2022  Referring MD/NP/PA:   PCP: Gracelyn Nurse, MD   Patient coming from:  The patient is coming from home.    Chief Complaint: SOB and leg edema  HPI: Andrew Benton is a 76 y.o. male with medical history significant of sCHF with EF 35%, A-fib not on anticoagulants, HTN, HLD, essentially solitary left kidney (as he has a completely atrophic right kidney with severe right hydronephrosis secondary to a large right distal ureteral stone), CKD-3b, tremor, gout, anxiety, who presents with SOB and leg edema.   Pt states that he has exertional shortness of breath for more than 1 month, which has worsened in the past several days.  Patient has cough with little mucus production.  Denies chest pain, fever or chills.  No nausea, vomiting, diarrhea or abdominal pain.  No symptoms of UTI.  Patient states that he has progressively worsening bilateral leg edema.  Of note, patient denies history of atrial fibrillation, but recent stress ECG showed atrial fibrillation.  Patient has been following up with [facility] cardiology, with an appointment on Thursday with cardiology.   Data reviewed independently and ED Course: pt was found to have BNP 494, troponin level 11, potassium 5.7, worsening renal function with creatinine 2.96, BUN 96, GFR 21 (recent baseline creatinine 1.7-1.8), temperature normal, blood pressure 130/82, heart rate 91, RR 20, oxygen sat 97% on room air.  Chest x-ray showed bilateral pleural effusion (left is larger than the right).  Patient is admitted to telemedicine patient.  Dr. Juliann Pares of cardiology is consulted.   EKG: I have personally reviewed.  Atrial fibrillation, QTc 399, low voltage, right axis deviation, poor R wave progression   Review of Systems:   General: no fevers, chills, no body weight gain, has fatigue HEENT: no blurry vision, hearing changes or sore throat Respiratory: has  dyspnea, coughing, no wheezing CV: no chest pain, no palpitations GI: no nausea, vomiting, abdominal pain, diarrhea, constipation GU: no dysuria, burning on urination, increased urinary frequency, hematuria  Ext: has leg edema Neuro: no unilateral weakness, numbness, or tingling, no vision change or hearing loss Skin: no rash, no skin tear. MSK: No muscle spasm, no deformity, no limitation of range of movement in spin Heme: No easy bruising.  Travel history: No recent long distant travel.   Allergy:  Allergies  Allergen Reactions   Primidone     Other reaction(s): Other (See Comments)    Past Medical History:  Diagnosis Date   Anemia    Arthritis    a little bit in hands    Hypertension    Kidney stones     Past Surgical History:  Procedure Laterality Date   COLONOSCOPY  2011   CYSTOSCOPY W/ RETROGRADES  05/19/2020   Procedure: CYSTOSCOPY WITH RETROGRADE PYELOGRAM;  Surgeon: Sondra Come, MD;  Location: [facility] ORS;  Service: Urology;;   CYSTOSCOPY WITH STENT PLACEMENT Left 05/19/2020   Procedure: CYSTOSCOPY WITH STENT PLACEMENT;  Surgeon: Sondra Come, MD;  Location: [facility] ORS;  Service: Urology;  Laterality: Left;   CYSTOSCOPY/URETEROSCOPY/HOLMIUM LASER/STENT PLACEMENT Left 06/02/2020   Procedure: CYSTOSCOPY/URETEROSCOPY/HOLMIUM LASER/STENT PLACEMENT;  Surgeon: Sondra Come, MD;  Location: [facility] ORS;  Service: Urology;  Laterality: Left;   EYE SURGERY  2016   TONSILLECTOMY      Social History:  reports that he has never smoked. He has never used smokeless tobacco. He reports that he does not currently use alcohol. He  reports that he does not use drugs.  Family History:  Family History  Problem Relation Age of Onset   Dementia Brother       Prior to Admission medications   Medication Sig Start Date End Date Taking? Authorizing Provider  allopurinol (ZYLOPRIM) 100 MG tablet Take 100 mg by mouth 2 (two) times daily.    [provider]  aspirin EC 81 MG  tablet Take 81 mg by mouth daily. Swallow whole.    [provider]  atorvastatin (LIPITOR) 10 MG tablet Take 10 mg by mouth every evening.     [provider]  furosemide (LASIX) 20 MG tablet Take by mouth. 08/21/22 08/21/23  [provider]  gabapentin (NEURONTIN) 300 MG capsule Take 1 capsule by mouth 2 (two) times daily. 07/10/22   [provider]  lisinopril (ZESTRIL) 10 MG tablet Take 10 mg by mouth daily. 07/26/21   [provider]  Multiple Vitamin (MULTIVITAMIN WITH MINERALS) TABS tablet Take 1 tablet by mouth daily.    [provider]  propranolol ER (INDERAL LA) 60 MG 24 hr capsule Take 60 mg by mouth daily. 02/05/21   [provider]  triamcinolone ointment (KENALOG) 0.1 % Apply topically 2 (two) times daily. 10/29/21   [provider]  vitamin B-12 (CYANOCOBALAMIN) 1000 MCG tablet Take 1,000 mcg by mouth daily.    [provider]    Physical Exam: Vitals:   10/15/2022 [id number] 10/27/2022 0840 11/01/2022 1054 10/20/2022 1100  BP: 130/82 103/85 92/81 95/78   Pulse: 91 86 90 78  Resp: 20  19 (!) 27  Temp: 97.7 F (36.5 C)  97.9 F (36.6 C)   TempSrc:   Oral   SpO2: 97% 96% 97% 100%  Weight:      Height:       General: Not in acute distress HEENT:       Eyes: PERRL, EOMI, no scleral icterus.       ENT: No discharge from the ears and nose, no pharynx injection, no tonsillar enlargement.        Neck: positive JVD, no bruit, no mass felt. Heme: No neck lymph node enlargement. Cardiac: S1/S2, irregularly irregular rhythm, no murmurs, No gallops or rubs. Respiratory: No rales, wheezing, rhonchi or rubs. GI: Soft, nondistended, nontender, no rebound pain, no organomegaly, BS present. GU: No hematuria Ext: 3+ pitting leg edema bilaterally. 1+DP/PT pulse bilaterally. Musculoskeletal: No joint deformities, No joint redness or warmth, no limitation of ROM in spin. Skin: No rashes.  Neuro: Alert, oriented X3, cranial  nerves II-XII grossly intact, moves all extremities normally. Psych: Patient is not psychotic, no suicidal or hemocidal ideation.  Labs on Admission: I have personally reviewed following labs and imaging studies  CBC: Recent Labs  Lab 10/17/2022 0656  WBC 7.5  NEUTROABS 5.3  HGB 12.9*  HCT 40.5  MCV 98.3  PLT [id number]   Basic Metabolic Panel: Recent Labs  Lab 10/18/2022 0656 10/22/2022 1042  NA 140  --   K 5.7* 5.4*  CL 111  --   CO2 23  --   GLUCOSE 115*  --   BUN 96*  --   CREATININE 2.96*  --   CALCIUM 9.1  --    GFR: Estimated Creatinine Clearance: 24.2 mL/min (A) (by C-G formula based on SCr of 2.96 mg/dL (H)). Liver Function Tests: Recent Labs  Lab 10/09/2022 0656  AST 44*  ALT 37  ALKPHOS 190*  BILITOT 1.4*  PROT 7.8  ALBUMIN 3.5  No results for input(s): "LIPASE", "AMYLASE" in the last 168 hours. No results for input(s): "AMMONIA" in the last 168 hours. Coagulation Profile: Recent Labs  Lab 10/14/2022 1150  INR 1.1   Cardiac Enzymes: No results for input(s): "CKTOTAL", "CKMB", "CKMBINDEX", "TROPONINI" in the last 168 hours. BNP (last 3 results) No results for input(s): "PROBNP" in the last 8760 hours. HbA1C: No results for input(s): "HGBA1C" in the last 72 hours. CBG: No results for input(s): "GLUCAP" in the last 168 hours. Lipid Profile: No results for input(s): "CHOL", "HDL", "LDLCALC", "TRIG", "CHOLHDL", "LDLDIRECT" in the last 72 hours. Thyroid Function Tests: No results for input(s): "TSH", "T4TOTAL", "FREET4", "T3FREE", "THYROIDAB" in the last 72 hours. Anemia Panel: No results for input(s): "VITAMINB12", "FOLATE", "FERRITIN", "TIBC", "IRON", "RETICCTPCT" in the last 72 hours. Urine analysis:    Component Value Date/Time   COLORURINE STRAW (A) 11/02/2022 1356   APPEARANCEUR CLEAR (A) 10/21/2022 1356   APPEARANCEUR Cloudy (A) 06/14/2020 1455   LABSPEC 1.009 10/24/2022 1356   PHURINE 5.0 10/22/2022 1356   GLUCOSEU NEGATIVE 10/04/2022 1356    HGBUR NEGATIVE 10/12/2022 1356   [place] 10/09/2022 1356   BILIRUBINUR Negative 06/14/2020 1455   KETONESUR NEGATIVE 10/12/2022 1356   PROTEINUR NEGATIVE 11/01/2022 1356   NITRITE NEGATIVE 10/12/2022 1356   LEUKOCYTESUR NEGATIVE 10/15/2022 1356   Sepsis Labs: @LABRCNTIP (procalcitonin:4,lacticidven:4) ) Recent Results (from the past 240 hour(s))  Resp panel by RT-PCR (RSV, Flu A&B, Covid) Anterior Nasal Swab     Status: None   Collection Time: 10/20/2022  9:30 AM   Specimen: Anterior Nasal Swab  Result Value Ref Range Status   SARS Coronavirus 2 by RT PCR NEGATIVE NEGATIVE Final    Comment: (NOTE) SARS-CoV-2 target nucleic acids are NOT DETECTED.  The SARS-CoV-2 RNA is generally detectable in upper respiratory specimens during the acute phase of infection. The lowest concentration of SARS-CoV-2 viral copies this assay can detect is 138 copies/mL. A negative result does not preclude SARS-Cov-2 infection and should not be used as the sole basis for treatment or other patient management decisions. A negative result may occur with  improper specimen collection/handling, submission of specimen other than nasopharyngeal swab, presence of viral mutation(s) within the areas targeted by this assay, and inadequate number of viral copies(<138 copies/mL). A negative result must be combined with clinical observations, patient history, and epidemiological information. The expected result is Negative.  Fact Sheet for Patients:  EntrepreneurPulse.com.au  Fact Sheet for Healthcare Providers:  IncredibleEmployment.be  This test is no t yet approved or cleared by the [place] FDA and  has been authorized for detection and/or diagnosis of SARS-CoV-2 by FDA under an Emergency Use Authorization (EUA). This EUA will remain  in effect (meaning this test can be used) for the duration of the COVID-19 declaration under Section 564(b)(1) of the Act,  21 U.S.C.section 360bbb-3(b)(1), unless the authorization is terminated  or revoked sooner.       Influenza A by PCR NEGATIVE NEGATIVE Final   Influenza B by PCR NEGATIVE NEGATIVE Final    Comment: (NOTE) The Xpert Xpress SARS-CoV-2/FLU/RSV plus assay is intended as an aid in the diagnosis of influenza from Nasopharyngeal swab specimens and should not be used as a sole basis for treatment. Nasal washings and aspirates are unacceptable for Xpert Xpress SARS-CoV-2/FLU/RSV testing.  Fact Sheet for Patients: EntrepreneurPulse.com.au  Fact Sheet for Healthcare Providers: IncredibleEmployment.be  This test is not yet approved or cleared by the [place] FDA and has been authorized for detection and/or  diagnosis of SARS-CoV-2 by FDA under an Emergency Use Authorization (EUA). This EUA will remain in effect (meaning this test can be used) for the duration of the COVID-19 declaration under Section 564(b)(1) of the Act, 21 U.S.C. section 360bbb-3(b)(1), unless the authorization is terminated or revoked.     Resp Syncytial Virus by PCR NEGATIVE NEGATIVE Final    Comment: (NOTE) Fact Sheet for Patients: BloggerCourse.com  Fact Sheet for Healthcare Providers: SeriousBroker.it  This test is not yet approved or cleared by the [place] FDA and has been authorized for detection and/or diagnosis of SARS-CoV-2 by FDA under an Emergency Use Authorization (EUA). This EUA will remain in effect (meaning this test can be used) for the duration of the COVID-19 declaration under Section 564(b)(1) of the Act, 21 U.S.C. section 360bbb-3(b)(1), unless the authorization is terminated or revoked.  Performed at [facility], [address] [place], [place] [address]      Radiological Exams on Admission: US RENAL  Result Date: 10/14/2022 CLINICAL DATA:  Acute kidney injury, history  hypertension, nephrolithiasis EXAM: RENAL / URINARY TRACT ULTRASOUND COMPLETE COMPARISON:  08/06/2021 FINDINGS: Right Kidney: Renal measurements: At least 10.1 cm length = volume: N/A mL. Severe RIGHT hydronephrosis identified no residual renal cortex visualized. No renal mass. Dilated proximal to mid RIGHT ureter up to 15 mm diameter. Shadowing calculus seen at mid ureter 11 mm diameter. Left Kidney: Renal measurements: 11.9 x 5.9 x 4.3 cm = volume: 159 mL. Tiny simple LEFT renal cyst 10 mm diameter; no follow-up imaging recommended. Normal cortical thickness and echogenicity. No additional mass, hydronephrosis or shadowing calcification. Bladder: Appears normal for degree of bladder distention. Other: Minimal ascites RIGHT upper quadrant.  Small LEFT pleural effusion. IMPRESSION: Marked RIGHT hydronephrosis and proximal to mid RIGHT hydroureter secondary to an 11 mm mid ureteral calculus. Marked RIGHT renal cortical thinning, with no residual cortex visualized. Minimal LEFT pleural effusion and RIGHT upper quadrant ascites. Electronically Signed   By: Ulyses Southward M.D.   On: 10/26/2022 11:21   DG Chest 2 View  Result Date: 10/11/2022 CLINICAL DATA:  Chest pain EXAM: CHEST - 2 VIEW COMPARISON:  05/18/2020 abdominal CT FINDINGS: Pleural effusions, borderline moderate on the left. Pleural fluid obscures the lung bases. No edema or pneumothorax. Possible cardiomegaly. No acute osseous finding. IMPRESSION: Left more than right pleural effusion obscuring the lower lungs. Electronically Signed   By: Tiburcio Pea M.D.   On: 10/07/2022 06:47      Assessment/Plan Principal Problem:   Acute on chronic systolic CHF (congestive heart failure) (HCC) Active Problems:   Atrial fibrillation, chronic (HCC)   Benign essential hypertension   Stage 3b chronic kidney disease (HCC)   Hyperkalemia   HLD (hyperlipidemia)   Obesity (BMI 30.0-34.9)   Assessment and Plan:   Acute on chronic systolic CHF (congestive  heart failure) (HCC): Recent 2D echo on 09/23/2022 showed EF 35%.  Patient has leg edema, elevated BNP 494, pleural effusion chest x-ray, clinically consistent with CHF exacerbation.  Consulted Dr. Juliann Pares of cardiology  -Will admit to tele as inpatient -Lasix 40 mg bid by IV --> change to lasix gtt per card -2d echo -Daily weights -strict I/O's -Low salt diet -Fluid restriction -Obtain REDs Vest reading  Atrial fibrillation, chronic (HCC):  patient denies history of atrial fibrillation, but recent stress ECG showed atrial fibrillation.  CHADS2 score is 4.  Patient is currently not taking anticoagulants.  I will defer to cardiologist for anticoagulant use.  -Tele monitoring -Continue home  propranolol -currently on IV heparin per card  Benign essential hypertension -Patient is on IV Lasix -Propranolol -Hold lisinopril due to worsening renal function -IV hydralazine as needed  Stage 3b chronic kidney disease [facility]): Patient has worsening renal function.  Recent baseline creatinine 1.7-1.8.  His creatinine is 2.96,  BUN 96 and GFR 21.  Likely due to cardiorenal syndrome.  Patient has essentially solitary left kidney. -Follow-up urinalysis -Follow-up renal ultrasound  Hyperkalemia: K 5.7 --> 5.4 after given IV lasix -pt received 40 mg of lasix. -will give 5 g of lokelma -will repeat K level at 19:00  HLD (hyperlipidemia) -Lipitor  Obesity (BMI 30.0-34.9): BMI 30.13, body weight 95.3 kg -Exercise and healthy diet -Encourage losing weight     DVT ppx: on IV heparin  Code Status: Full code  Family Communication:    Yes, patient's son   at bed side.     Disposition Plan:  Anticipate discharge back to previous environment  Consults called:  Dr. Juliann Pares of card  Admission status and Level of care: Telemetry Cardiac:    as inpt      Dispo: The patient is from: Home              Anticipated d/c is to: Home              Anticipated d/c date is: 2 days              Patient  currently is not medically stable to d/c.    Severity of Illness:  The appropriate patient status for this patient is INPATIENT. Inpatient status is judged to be reasonable and necessary in order to provide the required intensity of service to ensure the patient's safety. The patient's presenting symptoms, physical exam findings, and initial radiographic and laboratory data in the context of their chronic comorbidities is felt to place them at high risk for further clinical deterioration. Furthermore, it is not anticipated that the patient will be medically stable for discharge from the hospital within 2 midnights of admission.   * I certify that at the point of admission it is my clinical judgment that the patient will require inpatient hospital care spanning beyond 2 midnights from the point of admission due to high intensity of service, high risk for further deterioration and high frequency of surveillance required.*       Date of Service Oct 23, 2022    Lorretta Harp [facility]   If 7PM-7AM, please contact night-coverage [email or web address] 10-23-2022, 2:21 PM

## 2022-10-16 NOTE — Consult Note (Signed)
ANTICOAGULATION CONSULT NOTE  Pharmacy Consult for heparin drip Indication: atrial fibrillation  Allergies  Allergen Reactions   Primidone     Other reaction(s): Other (See Comments)    Patient Measurements: Height: 5\' 10"  (177.8 cm) Weight: 95.3 kg (210 lb) IBW/kg (Calculated) : 73 Heparin Dosing Weight: 92.5 kg  Vital Signs: Temp: 97.9 F (36.6 C) (12/13 1515) Temp Source: Oral (12/13 1515) BP: 103/82 (12/13 2000) Pulse Rate: 80 (12/13 2016)  Labs: Recent Labs    10/21/2022 0656 10/23/2022 0832 October 19, 2022 1150 10/18/2022 2245  HGB 12.9*  --   --   --   HCT 40.5  --   --   --   PLT 252  --   --   --   APTT  --   --  32  --   LABPROT  --   --  14.5  --   INR  --   --  1.1  --   HEPARINUNFRC  --   --   --  0.91*  CREATININE 2.96*  --   --   --   TROPONINIHS 11 10  --   --      Estimated Creatinine Clearance: 24.2 mL/min (A) (by C-G formula based on SCr of 2.96 mg/dL (H)).   Medical History: Past Medical History:  Diagnosis Date   Anemia    Arthritis    a little bit in hands    Hypertension    Kidney stones     Medications:  No home anticoagulants per pharmacist review  Assessment: 77 yo patient presented to ED with increasing SOB over last 1 to 2 months, especially with exertion.  Has PMH of CHF, CKD, HTN.  During work-up was found to be in Atrial fibrillation.  Pharmacy consulted to initiate heparin drip.  Baseline aPTT and PT/INR pending.  Hgb 12.9, plt = 252.   Goal of Therapy:  Heparin level 0.3-0.7 units/ml Monitor platelets by anticoagulation protocol: Yes   12/13 2245 HL 0.91 supratherapeutic  Plan:  Decrease heparin infusion to 1100 units/hr Recheck HL w/ AM labs after rate change CBC daily while on heparin  1/14, PharmD, [facility] Oct 19, 2022 11:22 PM

## 2022-10-17 DIAGNOSIS — I959 Hypotension, unspecified: Secondary | ICD-10-CM | POA: Diagnosis not present

## 2022-10-17 DIAGNOSIS — N1832 Chronic kidney disease, stage 3b: Secondary | ICD-10-CM

## 2022-10-17 DIAGNOSIS — N179 Acute kidney failure, unspecified: Secondary | ICD-10-CM

## 2022-10-17 DIAGNOSIS — I5023 Acute on chronic systolic (congestive) heart failure: Secondary | ICD-10-CM

## 2022-10-17 LAB — BASIC METABOLIC PANEL
Anion gap: 5 (ref 5–15)
BUN: 97 mg/dL — ABNORMAL HIGH (ref 8–23)
CO2: 22 mmol/L (ref 22–32)
Calcium: 8.8 mg/dL — ABNORMAL LOW (ref 8.9–10.3)
Chloride: 112 mmol/L — ABNORMAL HIGH (ref 98–111)
Creatinine, Ser: 2.86 mg/dL — ABNORMAL HIGH (ref 0.61–1.24)
GFR, Estimated: 22 mL/min — ABNORMAL LOW (ref >60)
Glucose, Bld: 113 mg/dL — ABNORMAL HIGH (ref 70–99)
Potassium: 5 mmol/L (ref 3.5–5.1)
Sodium: 139 mmol/L (ref 135–145)

## 2022-10-17 LAB — CBC
HCT: 35.6 % — ABNORMAL LOW (ref 39.0–52.0)
Hemoglobin: 11.6 g/dL — ABNORMAL LOW (ref 13.0–17.0)
MCH: 31.5 pg (ref 26.0–34.0)
MCHC: 32.6 g/dL (ref 30.0–36.0)
MCV: 96.7 fL (ref 80.0–100.0)
Platelets: 218 10*3/uL (ref 150–400)
RBC: 3.68 MIL/uL — ABNORMAL LOW (ref 4.22–5.81)
RDW: 16.2 % — ABNORMAL HIGH (ref 11.5–15.5)
WBC: 6.6 10*3/uL (ref 4.0–10.5)
nRBC: 0 % (ref 0.0–0.2)

## 2022-10-17 LAB — HEPARIN LEVEL (UNFRACTIONATED)
Heparin Unfractionated: 0.92 IU/mL — ABNORMAL HIGH (ref 0.30–0.70)
Heparin Unfractionated: 0.82 IU/mL — ABNORMAL HIGH (ref 0.30–0.70)

## 2022-10-17 LAB — MAGNESIUM: Magnesium: 2.6 mg/dL — ABNORMAL HIGH (ref 1.7–2.4)

## 2022-10-17 MED ORDER — MIDODRINE HCL 5 MG PO TABS
5.0000 mg | ORAL_TABLET | Freq: Three times a day (TID) | ORAL | Status: DC
  Administered 2022-10-17 – 2022-10-18 (×3): 5 mg via ORAL
  Filled 2022-10-17 (×3): qty 1

## 2022-10-17 MED ORDER — SODIUM CHLORIDE 0.9 % IV BOLUS
250.0000 mL | Freq: Once | INTRAVENOUS | Status: AC
  Administered 2022-10-17: 250 mL via INTRAVENOUS

## 2022-10-17 NOTE — Assessment & Plan Note (Signed)
Body mass index is 30.13 kg/m. Complicates overall care and prognosis.  Recommend lifestyle modifications including physical activity and diet for weight loss and overall long-term health.

## 2022-10-17 NOTE — Assessment & Plan Note (Addendum)
Anasarca Pleural effusions --[facility] cardiology following --Lasix drip remains on hold due to hypotension despite midodrine. Attempted to resume this AM but BP worsened --Transfer to stepdown for close monitoring, may need pressors / ionotropes --Consulted nephrology due to profound AKI with solitary kidney and needing diuresis --Strict I/O's, daily weights --Low sodium diet --Monitor renal function and electrolytes --Deferring repeat Echo for now, just done 09/23/22 - LVEF 35-40% with moderate RV systolic dysfunction, moderate TR, mild MR, trivial PR, no valvular stenosis --Will consider thoracentesis

## 2022-10-17 NOTE — Consult Note (Signed)
   Heart Failure Nurse Navigator Note  HFrEF 35-40%.  Ventricular systolic function is mildly reduced.  Right ventricle moderately enlarged.  Moderate tricuspid regurgitation.  Mild mitral regurgitation.  Presented to the emergency room with a 1 month history of increasing shortness of breath, leg in the edema and bendopnea.  Comorbidities:  Paroxysmal atrial fibrillation not on NOAC Hypertension Hyperlipidemia Solitary left kidney Chronic kidney disease stage III Gout Anxiety  Medications:  Aspirin 81 mg daily Atorvastatin 10 mg in the evening Furosemide Usen at 4 mg an hour currently on hold   Labs:  Sodium 139, potassium 5, chloride 112, CO2 22, BUN 97, creatinine 2.86, magnesium 2.6, hemoglobin 11.6 hematocrit 35.6, BNP 494, troponin 11. Weight documented at 95.3 kg on admission in the ED Intake 218 mL Output 500 mL BMI 30.13.  Still meeting with patient in the emergency room.  His son, Andrew Benton is also at the bedside.  He is currently in no acute distress.  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  Or that reason he eats his meals from restaurants.  Admits to using some salt.  Explained the reasoning for removing the saltshaker from the table. Instructed on using natural spices, Mrs. Andrew Benton.  And eating foods that are lower in sodium content.  Have made the dietitian aware to assist in making wiser choices when eating out.  Just 2000 mg sodium daily restriction.  Also discussed 64 ounce fluid restriction and what is considered a liquid.  Over the importance of daily weights and reporting 2 pound weight gain overnight or 5 pounds in the week or changes in symptoms.  He has follow-up in the outpatient heart failure clinic on January 2 at 1 PM.  He was given the living with heart failure teaching booklet, zone magnet, info on low-sodium and heart failure, long with weight chart.  He had no further  questions.  Tresa Endo RN [facility]

## 2022-10-17 NOTE — Plan of Care (Signed)
Nutrition Education Note  RD consulted for nutrition education regarding CHF.  Case discussed with Heart Failure Coordinator RN. She is requesting RD see this pt for further education. Pt eats a lot of restaurant and take out food.   Spoke with pt at bedside, who was pleasant and in good spirits today. Pt reports he did not eat much lunch, as he does not usually eat red meat or potatoes, as he eats a diet low in oxalates due to his kidneys. Pt explains that he eats mostly take out food from restaurants. Meals usually consist of grilled chicken and vegetables. Pt reports he eats a lot of beans for protein. He salts his food. Discussed restaurant foods that are lower in sodium and encouraged pt to try sodium free seasoning packet on tray for added flavor.   RD provided "Low Sodium Nutrition Therapy" handout from the Academy of Nutrition and Dietetics. Reviewed patient's dietary recall. Provided examples on ways to decrease sodium intake in diet. Discouraged intake of processed foods and use of salt shaker. Encouraged fresh fruits and vegetables as well as whole grain sources of carbohydrates to maximize fiber intake.   RD discussed why it is important for patient to adhere to diet recommendations, and emphasized the role of fluids, foods to avoid, and importance of weighing self daily. Teach back method used.  Expect fair compliance.  Body mass index is 30.13 kg/m. Pt meets criteria for obesity, class I based on current BMI. Obesity is a complex, chronic medical condition that is optimally managed by a multidisciplinary care team. Weight loss is not an ideal goal for an acute inpatient hospitalization. However, if further work-up for obesity is warranted, consider outpatient referral to outpatient bariatric service and/or [facility].    Current diet order is 2 gram sodium with 1.5 L fluid restriction, patient is consuming approximately n/a% of meals at this  time. Labs and medications reviewed. No further nutrition interventions warranted at this time. RD contact information provided. If additional nutrition issues arise, please re-consult RD.   Levada Schilling, RD, LDN, CDCES Registered Dietitian II Certified Diabetes Care and Education Specialist Please refer to [facility] for RD and/or RD on-call/weekend/after hours pager

## 2022-10-17 NOTE — ED Notes (Signed)
Lasix stopped by doctor's orders due to hypotension

## 2022-10-17 NOTE — Assessment & Plan Note (Addendum)
HR has been controlled.  [facility] cardiology following. Telemetry monitoring On heparin drip for now. DOAC planned at d/c for anticoagulation Propranolol on hold due to hypotension Cardiology may do cardioversion after 4 weeks uninterrupted anticoagulation.

## 2022-10-17 NOTE — Assessment & Plan Note (Signed)
K on admission was 5.7, improved after Lasix and Lokelma. Monitor BMP

## 2022-10-17 NOTE — Hospital Course (Signed)
Andrew Benton is a 76 y.o. male with medical history significant of sCHF with EF 35%, A-fib not on anticoagulants, HTN, HLD, essentially solitary left kidney (as he has a completely atrophic right kidney with severe right hydronephrosis secondary to a large right distal ureteral stone), CKD-3b, tremor, gout, anxiety, who presented on 2022/10/20 for evaluation of worsening shortness of breath and leg edema, in addition to cough mostly at night when laying down.    Of note, patient denies history of atrial fibrillation, but recent stress ECG showed atrial fibrillation.  Patient has been following up with [facility] cardiology, with an appointment on Thursday with cardiology.   Evaluation in the ED was consistent with decompensated heart failure with BNP 494, chest x-ray with bilateral pleural effusions (L>R).    Admitted to medicine service with [facility] cardiology consulted. Started on IV Lasix drip.  12/14 - pt having profound hypotension.  Lasix drip had to be stopped.  Given 250 cc fluid bolus this afternoon.

## 2022-10-17 NOTE — Progress Notes (Signed)
Progress Note   Patient: Andrew Benton URK:[id number] DOB: [birth year] DOA: 10/26/2022     1 DOS: the patient was seen and examined on 10/17/2022   Brief hospital course: Charistopher Rumble is a 76 y.o. male with medical history significant of sCHF with EF 35%, A-fib not on anticoagulants, HTN, HLD, essentially solitary left kidney (as he has a completely atrophic right kidney with severe right hydronephrosis secondary to a large right distal ureteral stone), CKD-3b, tremor, gout, anxiety, who presented on 10/21/2022 for evaluation of worsening shortness of breath and leg edema, in addition to cough mostly at night when laying down.    Of note, patient denies history of atrial fibrillation, but recent stress ECG showed atrial fibrillation.  Patient has been following up with [facility] cardiology, with an appointment on Thursday with cardiology.   Evaluation in the ED was consistent with decompensated heart failure with BNP 494, chest x-ray with bilateral pleural effusions (L>R).    Admitted to medicine service with [facility] cardiology consulted. Started on IV Lasix drip.  12/14 - pt having profound hypotension.  Lasix drip had to be stopped.  Given 250 cc fluid bolus this afternoon.   Assessment and Plan: * Acute on chronic systolic CHF (congestive heart failure) (HCC) Anasarca Pleural effusions --[facility] cardiology following --Lasix drip had to be stopped today due to hypotension --Consulted nephrology due to profound AKI with solitary kidney and needing diuresis --Strict I/O's, daily weights --Low sodium diet --Monitor renal function and electrolytes --Deferring repeat Echo for now, just done 09/23/22 - LVEF 35-40% with moderate RV systolic dysfunction, moderate TR, mild MR, trivial PR, no valvular stenosis --Will consider thoracentesis  Hypotension 12/14: BP on admission was normal, but with diuresis has been significantly hypotensive today. At times, MAP dropping below 65. --Stopped Lasix  drip --Cardiology updated --Responded well to 250 cc NS bolus (68/47 >> 85/65 MAP 73) --Holding lisinopril and propranolol --Maintain MAP > 65 --Start midodrine 5 mg TID   Hyperkalemia K on admission was 5.7, improved after Lasix and Lokelma. Monitor BMP   Acute renal failure (HCC) Baseline CKD stage IIIb, solitary left kidney. Baseline creatinine over the past year is 1.7-1.8 with GFR ranged from 37-40.  On admission with significant AKI with BUN/creatinine 96/2.96 and GFR of 21. Minimal improvement (but no worsening) with diuresis  --Nephrology consulted given need for diuresis --Monitor BMP --Avoid nephrotoxins, hypotension (keep MAP>65)  PAF (paroxysmal atrial fibrillation) (HCC) HR has been controlled.  [facility] cardiology following. Telemetry monitoring On heparin drip for now. DOAC planned at d/c for anticoagulation Propranolol on hold due to hypotension Cardiology may do cardioversion after 4 weeks uninterrupted anticoagulation.  Stage 3b chronic kidney disease (HCC) See acute renal failure.  Benign essential hypertension Currently hypotensive, required small fluid bolus today.  Started on midodrine. Continue holding propranolol, lisinopril.  Obesity (BMI 30.0-34.9) Body mass index is 30.13 kg/m. Complicates overall care and prognosis.  Recommend lifestyle modifications including physical activity and diet for weight loss and overall long-term health.   HLD (hyperlipidemia) Continue Lipitor  Gout No acute flare.  Monitor.  Continue allopurinol.  Peripheral polyneuropathy Continue gabapentin        Subjective: Pt seen with 2 sons at bedside, in ED holding for a bed this AM.  Pt reports feeling okay, but does not feel he's had much increase in urine output.  Swelling is persistent, not really improved yet.  Cough he's had recently noted at nighttime when laying down in bed.  No other acute  complaints at this time.    Physical Exam: Vitals:   10/17/22 1200  10/17/22 1304 10/17/22 1329 10/17/22 1423  BP: 101/86 (!) 77/55 (!) 68/47 (!) 85/65  Pulse: 70 72    Resp: (!) 21 20  20   Temp:  97.6 F (36.4 C)  98 F (36.7 C)  TempSrc:  Oral  Oral  SpO2: 96% 100%  97%  Weight:      Height:       General exam: awake, alert, no acute distress HEENT: moist mucus membranes, hearing grossly normal  Respiratory system: CTAB diminished bases, no wheezes or rhonchi, normal respiratory effort. Cardiovascular system: normal S1/S2, RRR, tense b/l LE tense edema which extends proximally to lower abdominal wall and groin Gastrointestinal system: soft, NT, ND, no HSM felt, +bowel sounds. Central nervous system: A&O x3. no gross focal neurologic deficits, normal speech Extremities: tense edema of b/l LE's as above Skin: dry, intact, normal temperature Psychiatry: normal mood, congruent affect, judgement and insight appear normal   Data Reviewed:  Notable labs --- Cl 112, glucosed 113, BUN 97, Cr 2.86 from 2.96, Ca 8.8, Mg 2.6, GFR 22, Hbg 11.6  Family Communication: two sons at bedside on rounds  Disposition: Status is: Inpatient Remains inpatient appropriate because: hypotensive, remains volume overloaded, requiring IV therapies as outlined.   Planned Discharge Destination: Home    Time spent: 60 minutes including time at bedside and in coordination of care.   Author: , DO 10/17/2022 3:36 PM  For on call review [email or web address].

## 2022-10-17 NOTE — Progress Notes (Signed)
   10/17/22 1304  Assess: MEWS Score  Temp 97.6 F (36.4 C)  BP (!) 77/55  MAP (mmHg) (!) 63  Pulse Rate 72  Resp 20  SpO2 100 %  O2 Device Room Air  Assess: MEWS Score  MEWS Temp 0  MEWS Systolic 2  MEWS Pulse 0  MEWS RR 0  MEWS LOC 0  MEWS Score 2  MEWS Score Color Yellow  Assess: if the MEWS score is Yellow or Red  Were vital signs taken at a resting state? Yes  Focused Assessment No change from prior assessment  Does the patient meet 2 or more of the SIRS criteria? No  MEWS guidelines implemented *See Row Information* Yes  Treat  MEWS Interventions Administered prn meds/treatments (250 ml bolus given)  Pain Scale 0-10  Pain Score 0  Take Vital Signs  Increase Vital Sign Frequency  Yellow: Q 2hr X 2 then Q 4hr X 2, if remains yellow, continue Q 4hrs  Escalate  MEWS: Escalate Yellow: discuss with charge nurse/RN and consider discussing with provider and RRT  Notify: Charge Nurse/RN  Name of Charge Nurse/RN Notified Jessica, RN  Date Charge Nurse/RN Notified 10/17/22  Time Charge Nurse/RN Notified 1307  Provider Notification  Provider Name/Title Dr. Cannon Kettle  Date Provider Notified 10/17/22  Time Provider Notified 1307  Method of Notification Page  Notification Reason Other (Comment) (Low BP)  Provider response See new orders (250 ml bolus ordered)  Date of Provider Response 10/17/22  Time of Provider Response 1312  Assess: SIRS CRITERIA  SIRS Temperature  0  SIRS Pulse 0  SIRS Respirations  0  SIRS WBC 0  SIRS Score Sum  0

## 2022-10-17 NOTE — Assessment & Plan Note (Signed)
See acute renal failure.

## 2022-10-17 NOTE — Discharge Instructions (Signed)

## 2022-10-17 NOTE — Assessment & Plan Note (Signed)
Continue gabapentin.

## 2022-10-17 NOTE — Progress Notes (Signed)
[facility]  ROUNDING NOTE   Subjective:   Shuan Statzer is a 76 year old male with past medical conditions including atrial fibrillation, hyperlipidemia, hypertension, systolic heart failure, solitary left kidney, and chronic kidney disease stage IIIb.  Patient presents to the emergency department with shortness of breath and lower extremity edema.  Patient has been admitted for Pleural effusion [J90] Acute on chronic combined systolic and diastolic congestive heart failure (HCC) [I50.43] AKI (acute kidney injury) (HCC) [N17.9] Acute on chronic systolic CHF (congestive heart failure) (HCC) [I50.23]  Patient is known to our practice and is followed outpatient by Dr. Wynelle Link.  Has recently seen Dr. Cherylann Ratel in office for routine follow-up appointment.  Patient states that his shortness of breath has progressively gotten worse over the past few weeks.  He is unable to perform ADLs without rest breaks.  Denies known sick contacts.  Denies fever or chills does report productive cough.  States he noticed lower extremity edema over the past few days.  Denies nausea, vomiting, or diarrhea.  Labs on ED arrival significant for potassium 5.7, glucose 115, BUN 96, creatinine 2.96 with GFR 21, BNP 494, and hemoglobin 12.9.  Patient negative for influenza, COVID-19, and RSV.  Renal ultrasound stable.  Chest x-ray shows bilateral pleural effusions, left greater than right.  Elevated potassium treated in ED.  Currently 5.0.  We have been consulted to manage acute kidney injury.   Objective:  Vital signs in last 24 hours:  Temp:  [97.6 F (36.4 C)-98.1 F (36.7 C)] 98 F (36.7 C) (12/14 1423) Pulse Rate:  [36-85] 72 (12/14 1304) Resp:  [12-22] 20 (12/14 1423) BP: (47-103)/(38-86) 85/65 (12/14 1423) SpO2:  [92 %-100 %] 97 % (12/14 1423)  Weight change:  Filed Weights   10/30/2022 0628  Weight: 95.3 kg    Intake/Output: I/O last 3 completed shifts: In: 218.8 [I.V.:218.8] Out: 500  [Urine:500]   Intake/Output this shift:  Total I/O In: 13.3 [I.V.:13.3] Out: -   Physical Exam: General: Ill-appearing  Head: Normocephalic, atraumatic. Moist oral mucosal membranes  Eyes: Anicteric  Lungs:  Clear to auscultation, normal effort  Heart: Regular rate and rhythm  Abdomen:  Soft, nontender  Extremities: 1+ peripheral edema.  Neurologic: Nonfocal, moving all four extremities  Skin: No lesions  Access: None    Basic Metabolic Panel: Recent Labs  Lab 10-30-2022 0656 October 30, 2022 1042 10/30/2022 1521 10/30/22 1844 10/17/22 0638  NA 140  --   --   --  139  K 5.7* 5.4* 5.1 4.8 5.0  CL 111  --   --   --  112*  CO2 23  --   --   --  22  GLUCOSE 115*  --   --   --  113*  BUN 96*  --   --   --  97*  CREATININE 2.96*  --   --   --  2.86*  CALCIUM 9.1  --   --   --  8.8*  MG  --   --   --   --  2.6*    Liver Function Tests: Recent Labs  Lab 10/30/22 0656  AST 44*  ALT 37  ALKPHOS 190*  BILITOT 1.4*  PROT 7.8  ALBUMIN 3.5   No results for input(s): "LIPASE", "AMYLASE" in the last 168 hours. No results for input(s): "AMMONIA" in the last 168 hours.  CBC: Recent Labs  Lab 2022-10-30 0656 10/17/22 0638  WBC 7.5 6.6  NEUTROABS 5.3  --   HGB  12.9* 11.6*  HCT 40.5 35.6*  MCV 98.3 96.7  PLT 252 218    Cardiac Enzymes: No results for input(s): "CKTOTAL", "CKMB", "CKMBINDEX", "TROPONINI" in the last 168 hours.  BNP: Invalid input(s): "POCBNP"  CBG: No results for input(s): "GLUCAP" in the last 168 hours.  Microbiology: Results for orders placed or performed during the hospital encounter of 2022-10-29  Resp panel by RT-PCR (RSV, Flu A&B, Covid) Anterior Nasal Swab     Status: None   Collection Time: 2022-10-29  9:30 AM   Specimen: Anterior Nasal Swab  Result Value Ref Range Status   SARS Coronavirus 2 by RT PCR NEGATIVE NEGATIVE Final    Comment: (NOTE) SARS-CoV-2 target nucleic acids are NOT DETECTED.  The SARS-CoV-2 RNA is generally detectable in upper  respiratory specimens during the acute phase of infection. The lowest concentration of SARS-CoV-2 viral copies this assay can detect is 138 copies/mL. A negative result does not preclude SARS-Cov-2 infection and should not be used as the sole basis for treatment or other patient management decisions. A negative result may occur with  improper specimen collection/handling, submission of specimen other than nasopharyngeal swab, presence of viral mutation(s) within the areas targeted by this assay, and inadequate number of viral copies(<138 copies/mL). A negative result must be combined with clinical observations, patient history, and epidemiological information. The expected result is Negative.  Fact Sheet for Patients:  BloggerCourse.com  Fact Sheet for Healthcare Providers:  SeriousBroker.it  This test is no t yet approved or cleared by the [place] FDA and  has been authorized for detection and/or diagnosis of SARS-CoV-2 by FDA under an Emergency Use Authorization (EUA). This EUA will remain  in effect (meaning this test can be used) for the duration of the COVID-19 declaration under Section 564(b)(1) of the Act, 21 U.S.C.section 360bbb-3(b)(1), unless the authorization is terminated  or revoked sooner.       Influenza A by PCR NEGATIVE NEGATIVE Final   Influenza B by PCR NEGATIVE NEGATIVE Final    Comment: (NOTE) The Xpert Xpress SARS-CoV-2/FLU/RSV plus assay is intended as an aid in the diagnosis of influenza from Nasopharyngeal swab specimens and should not be used as a sole basis for treatment. Nasal washings and aspirates are unacceptable for Xpert Xpress SARS-CoV-2/FLU/RSV testing.  Fact Sheet for Patients: BloggerCourse.com  Fact Sheet for Healthcare Providers: SeriousBroker.it  This test is not yet approved or cleared by the [place] FDA and has been  authorized for detection and/or diagnosis of SARS-CoV-2 by FDA under an Emergency Use Authorization (EUA). This EUA will remain in effect (meaning this test can be used) for the duration of the COVID-19 declaration under Section 564(b)(1) of the Act, 21 U.S.C. section 360bbb-3(b)(1), unless the authorization is terminated or revoked.     Resp Syncytial Virus by PCR NEGATIVE NEGATIVE Final    Comment: (NOTE) Fact Sheet for Patients: BloggerCourse.com  Fact Sheet for Healthcare Providers: SeriousBroker.it  This test is not yet approved or cleared by the [place] FDA and has been authorized for detection and/or diagnosis of SARS-CoV-2 by FDA under an Emergency Use Authorization (EUA). This EUA will remain in effect (meaning this test can be used) for the duration of the COVID-19 declaration under Section 564(b)(1) of the Act, 21 U.S.C. section 360bbb-3(b)(1), unless the authorization is terminated or revoked.  Performed at [facility], [address], [place], [place] [address]     Coagulation Studies: Recent Labs    2022/10/29 1150  LABPROT 14.5  INR  1.1    Urinalysis: Recent Labs    10/21/2022 1356  COLORURINE STRAW*  LABSPEC 1.009  PHURINE 5.0  GLUCOSEU NEGATIVE  HGBUR NEGATIVE  BILIRUBINUR NEGATIVE  KETONESUR NEGATIVE  PROTEINUR NEGATIVE  NITRITE NEGATIVE  LEUKOCYTESUR NEGATIVE      Imaging: US RENAL  Result Date: 10/04/2022 CLINICAL DATA:  Acute kidney injury, history hypertension, nephrolithiasis EXAM: RENAL / URINARY TRACT ULTRASOUND COMPLETE COMPARISON:  08/06/2021 FINDINGS: Right Kidney: Renal measurements: At least 10.1 cm length = volume: N/A mL. Severe RIGHT hydronephrosis identified no residual renal cortex visualized. No renal mass. Dilated proximal to mid RIGHT ureter up to 15 mm diameter. Shadowing calculus seen at mid ureter 11 mm diameter. Left Kidney: Renal measurements: 11.9 x 5.9 x  4.3 cm = volume: 159 mL. Tiny simple LEFT renal cyst 10 mm diameter; no follow-up imaging recommended. Normal cortical thickness and echogenicity. No additional mass, hydronephrosis or shadowing calcification. Bladder: Appears normal for degree of bladder distention. Other: Minimal ascites RIGHT upper quadrant.  Small LEFT pleural effusion. IMPRESSION: Marked RIGHT hydronephrosis and proximal to mid RIGHT hydroureter secondary to an 11 mm mid ureteral calculus. Marked RIGHT renal cortical thinning, with no residual cortex visualized. Minimal LEFT pleural effusion and RIGHT upper quadrant ascites. Electronically Signed   By: Ulyses Southward M.D.   On: 10/25/2022 11:21   DG Chest 2 View  Result Date: 10/15/2022 CLINICAL DATA:  Chest pain EXAM: CHEST - 2 VIEW COMPARISON:  05/18/2020 abdominal CT FINDINGS: Pleural effusions, borderline moderate on the left. Pleural fluid obscures the lung bases. No edema or pneumothorax. Possible cardiomegaly. No acute osseous finding. IMPRESSION: Left more than right pleural effusion obscuring the lower lungs. Electronically Signed   By: Tiburcio Pea M.D.   On: 10/23/2022 06:47     Medications:    heparin 900 Units/hr (10/17/22 1045)    allopurinol  100 mg Oral BID   aspirin EC  81 mg Oral Daily   atorvastatin  10 mg Oral QPM   cyanocobalamin  1,000 mcg Oral Daily   gabapentin  300 mg Oral BID   midodrine  5 mg Oral TID WC   multivitamin with minerals  1 tablet Oral Daily   acetaminophen, albuterol, dextromethorphan-guaiFENesin, hydrALAZINE, ondansetron (ZOFRAN) IV  Assessment/ Plan:  Mr. Adric Wrede is a 76 y.o.  male with past medical conditions including atrial fibrillation, hyperlipidemia, hypertension, systolic heart failure, solitary left kidney, and chronic kidney disease stage IIIb.  Patient presents to the emergency department with shortness of breath and lower extremity edema.  Patient has been admitted for Pleural effusion [J90] Acute on  chronic combined systolic and diastolic congestive heart failure (HCC) [I50.43] AKI (acute kidney injury) (HCC) [N17.9] Acute on chronic systolic CHF (congestive heart failure) (HCC) [I50.23]   Acute Kidney Injury on chronic kidney disease stage IIIb with baseline creatinine 1.7 and GFR of 41 on 08/13/22.  Acute kidney injury secondary to cardiorenal syndrome Chest x-ray shows shortness of breath.  Renal ultrasound shows nonobstructive cyst, stable presentation.  Currently receiving IV diuresis however hypotension currently exists.  Diuresis stopped.  Patient ordered 500 mL bolus.  Lab Results  Component Value Date   CREATININE 2.86 (H) 10/17/2022   CREATININE 2.96 (H) 10/05/2022   CREATININE 1.84 (H) 05/31/2020    Intake/Output Summary (Last 24 hours) at 10/17/2022 1637 Last data filed at 10/17/2022 1031 Gross per 24 hour  Intake 232.05 ml  Output 500 ml  Net -267.95 ml   2. Anemia of chronic kidney disease  Lab Results  Component Value Date   HGB 11.6 (L) 10/17/2022   Hemoglobin above desired target.  Will continue to monitor.  3. Secondary Hyperparathyroidism: with outpatient labs: PTH 44, phosphorus 3.7, calcium 9.5 on 06/25/22.   Lab Results  Component Value Date   CALCIUM 8.8 (L) 10/17/2022    Calcium within desired range.  Will continue to monitor.  4.  Acute on chronic systolic and diastolic heart failure.  Echo completed on 09/23/2022 shows EF 35%.    LOS: 1 Teretha Chalupa 12/14/20234:37 PM

## 2022-10-17 NOTE — Care Management Important Message (Signed)
Important Message  Patient Details  Name: Andrew Benton MRN: [id number] Date of Birth: July 21, 1946   Medicare Important Message Given:  N/A - LOS <3 / Initial given by admissions     [name] 10/17/2022, 5:06 PM

## 2022-10-17 NOTE — Progress Notes (Signed)
[facility] CLINIC CARDIOLOGY CONSULT NOTE       Patient ID: Andrew Benton MRN: [id number] DOB/AGE: [birth year] 76 y.o.  Admit date: November 13, 2022 Referring Physician Dr. Clyde Lundborg Primary Physician Dr. Marcelino Duster Primary Cardiologist Dr. Gwen Pounds / Minda Ditto, PA-C Reason for Consultation AoCHF  HPI: Andrew Benton is a [phone] with a PMH of NICM (35-40%, mod LV & RV dysfunction, mod TR 09/23/22), atrophic R kidney, CKD 3b, chronic anemia, HTN, HLD, essential tremor who presented to [facility] ED 2022/11/13 with dyspnea on exertion over the past 2 months, with significant peripheral edema, orthopnea, acutely worsening last night.  EKG on admission shows rate controlled atrial fibrillation. Cardiology is consulted for assistance with his heart failure.  Interval History:  -no acute events -didn't sleep well last night, no chest pain, or shortness of breath at rest -anasarca remains, slight softening in upper thigh edema -Cr 2.96 -- 2.86, GFR 22 after starting lasix infusion at 4mg /hr yesterday -remains in rate controlled AF, with borderline hypotension  Review of systems complete and found to be negative unless listed above     Past Medical History:  Diagnosis Date   Anemia    Arthritis    a little bit in hands    Hypertension    Kidney stones     Past Surgical History:  Procedure Laterality Date   COLONOSCOPY  2011   CYSTOSCOPY W/ RETROGRADES  05/19/2020   Procedure: CYSTOSCOPY WITH RETROGRADE PYELOGRAM;  Surgeon: 05/21/2020, MD;  Location: [facility] ORS;  Service: Urology;;   CYSTOSCOPY WITH STENT PLACEMENT Left 05/19/2020   Procedure: CYSTOSCOPY WITH STENT PLACEMENT;  Surgeon: 05/21/2020, MD;  Location: [facility] ORS;  Service: Urology;  Laterality: Left;   CYSTOSCOPY/URETEROSCOPY/HOLMIUM LASER/STENT PLACEMENT Left 06/02/2020   Procedure: CYSTOSCOPY/URETEROSCOPY/HOLMIUM LASER/STENT PLACEMENT;  Surgeon: 06/04/2020, MD;  Location: [facility] ORS;  Service: Urology;  Laterality:  Left;   EYE SURGERY  2016   TONSILLECTOMY      (Not in a hospital admission)  Social History   Socioeconomic History   Marital status: Widowed    Spouse name: Not on file   Number of children: Not on file   Years of education: Not on file   Highest education level: Not on file  Occupational History   Not on file  Tobacco Use   Smoking status: Never   Smokeless tobacco: Never  Vaping Use   Vaping Use: Never used  Substance and Sexual Activity   Alcohol use: Not Currently   Drug use: Never   Sexual activity: Yes    Birth control/protection: None  Other Topics Concern   Not on file  Social History Narrative   Not on file   Social Determinants of Health   Financial Resource Strain: Not on file  Food Insecurity: Not on file  Transportation Needs: Not on file  Physical Activity: Not on file  Stress: Not on file  Social Connections: Not on file  Intimate Partner Violence: Not on file    Family History  Problem Relation Age of Onset   Dementia Brother       Intake/Output Summary (Last 24 hours) at 10/17/2022 0825 Last data filed at 10/17/2022 0335 Gross per 24 hour  Intake 218.78 ml  Output 500 ml  Net -281.22 ml    Vitals:   10/17/22 0000 10/17/22 0100 10/17/22 0412 10/17/22 0600  BP: (!) 89/60  (!) 86/65 91/68  Pulse: 85 82 82 80  Resp: (!) 22 19 19 16   Temp:  97.8 F (36.6 C)  98.1 F (36.7 C)  TempSrc:  Oral  Oral  SpO2: 95% 97% 94% 94%  Weight:      Height:        PHYSICAL EXAM General: Pleasant elderly Caucasian male, well nourished, in no acute distress. Sitting upright with legs off bed, two sons at bedside HEENT:  Normocephalic and atraumatic. Neck:  No JVD.  Lungs: Slight conversational dyspnea on room air absent bibasilar breath sounds, no appreciable wheezes. heart: Irregularly irregular with controlled rate. Normal S1 and S2 without gallops or murmurs.  Abdomen: Mildly distended appearing, soft and nontender Msk: Normal strength and  tone for age. Extremities:  No clubbing, cyanosis.  Significant 2-3+ pitting edema from his ankles, up to his thighs. Sight softening of upper leg edema.  Neuro: Alert and oriented X 3. Psych:  Answers questions appropriately.   Labs: Basic Metabolic Panel: Recent Labs    2022/11/08 0656 11-08-22 1042 11/08/2022 1844 10/17/22 0638  NA 140  --   --  139  K 5.7*   < > 4.8 5.0  CL 111  --   --  112*  CO2 23  --   --  22  GLUCOSE 115*  --   --  113*  BUN 96*  --   --  97*  CREATININE 2.96*  --   --  2.86*  CALCIUM 9.1  --   --  8.8*  MG  --   --   --  2.6*   < > = values in this interval not displayed.    Liver Function Tests: Recent Labs    2022/11/08 0656  AST 44*  ALT 37  ALKPHOS 190*  BILITOT 1.4*  PROT 7.8  ALBUMIN 3.5    No results for input(s): "LIPASE", "AMYLASE" in the last 72 hours. CBC: Recent Labs    08-Nov-2022 0656 10/17/22 0638  WBC 7.5 6.6  NEUTROABS 5.3  --   HGB 12.9* 11.6*  HCT 40.5 35.6*  MCV 98.3 96.7  PLT 252 218    Cardiac Enzymes: Recent Labs    2022/11/08 0656 11-08-22 0832  TROPONINIHS 11 10    BNP: Recent Labs    2022/11/08 0656  BNP 494.2*    D-Dimer: No results for input(s): "DDIMER" in the last 72 hours. Hemoglobin A1C: No results for input(s): "HGBA1C" in the last 72 hours. Fasting Lipid Panel: No results for input(s): "CHOL", "HDL", "LDLCALC", "TRIG", "CHOLHDL", "LDLDIRECT" in the last 72 hours. Thyroid Function Tests: No results for input(s): "TSH", "T4TOTAL", "T3FREE", "THYROIDAB" in the last 72 hours.  Invalid input(s): "FREET3" Anemia Panel: No results for input(s): "VITAMINB12", "FOLATE", "FERRITIN", "TIBC", "IRON", "RETICCTPCT" in the last 72 hours.   Radiology: US RENAL  Result Date: 11-08-22 CLINICAL DATA:  Acute kidney injury, history hypertension, nephrolithiasis EXAM: RENAL / URINARY TRACT ULTRASOUND COMPLETE COMPARISON:  08/06/2021 FINDINGS: Right Kidney: Renal measurements: At least 10.1 cm length =  volume: N/A mL. Severe RIGHT hydronephrosis identified no residual renal cortex visualized. No renal mass. Dilated proximal to mid RIGHT ureter up to 15 mm diameter. Shadowing calculus seen at mid ureter 11 mm diameter. Left Kidney: Renal measurements: 11.9 x 5.9 x 4.3 cm = volume: 159 mL. Tiny simple LEFT renal cyst 10 mm diameter; no follow-up imaging recommended. Normal cortical thickness and echogenicity. No additional mass, hydronephrosis or shadowing calcification. Bladder: Appears normal for degree of bladder distention. Other: Minimal ascites RIGHT upper quadrant.  Small LEFT pleural effusion. IMPRESSION: Marked RIGHT hydronephrosis and  proximal to mid RIGHT hydroureter secondary to an 11 mm mid ureteral calculus. Marked RIGHT renal cortical thinning, with no residual cortex visualized. Minimal LEFT pleural effusion and RIGHT upper quadrant ascites. Electronically Signed   By: Ulyses Southward M.D.   On: 10/15/2022 11:21   DG Chest 2 View  Result Date: 10/15/2022 CLINICAL DATA:  Chest pain EXAM: CHEST - 2 VIEW COMPARISON:  05/18/2020 abdominal CT FINDINGS: Pleural effusions, borderline moderate on the left. Pleural fluid obscures the lung bases. No edema or pneumothorax. Possible cardiomegaly. No acute osseous finding. IMPRESSION: Left more than right pleural effusion obscuring the lower lungs. Electronically Signed   By: Tiburcio Pea M.D.   On: 10/29/2022 06:47    Treadmill Myoview 09/23/22  Impression  Indeterminant treadmill EKG due to baseline EKG changes and atrial fibrillation Normal myocardial perfusion without evidence of myocardial ischemia  Arnoldo Hooker Narrative  Table formatting from the original result was not included. CARDIOLOGY DEPARTMENT [facility] [address] [place], [place]  [address] [phone]  Procedure: Exercise Myocardial Perfusion Imaging   ONE day procedure  Indication: Cardiomyopathy, nonischemic (CMS-HCC) Plan: NM  myocardial perfusion SPECT multiple (stress       and rest), ECG stress test only  SOBOE (shortness of breath on exertion) Plan: NM myocardial perfusion SPECT multiple (stress       and rest), ECG stress test only  Bilateral leg edema Plan: NM myocardial perfusion SPECT multiple (stress       and rest), ECG stress test only  Ordering Physician:  Dr. Arnoldo Hooker  Clinical History: 76 y.o. year old male Vitals: Height: 70 in  Weight: 193 lb Cardiac risk factors include:    CARDIOMYOPATHY, Hyperlipidemia, and HTN  Procedure: The patient performed treadmill exercise using a Bruce protocol for 3:00 minutes. The exercise test was stopped due to fatigue.  Blood pressure response was  MIXED . The patient developed symptoms other than fatigue during the procedure; specific symptoms included SEVERE MALAISE AND DYSPNEA  Rest HR: 90bpm Rest BP: 142/43mmHg Max HR: 137bpm Max BP: 128/69mmHg Mets:     4.60 % MAX HR:   95%  Stress Test Administered by: Percell Boston, CMA  ECG Interpretation: Rest ECG:  atrial fibrillation, none Stress ECG:  atrial fibrillation,   Recovery ECG:  atrial fibrillation ECG Interpretation:  negative, no ECG changes.  Gated post-stress perfusion imaging was performed 30 minutes after stress. Rest images were performed 30 minutes after injection.  Gated LV Analysis:  Summary of LV Perfusion: Normal  Summary of LV Function: Normal  TID:  1.03  LVEF= 41%  ECHO 09/23/2022 MODERATE LV SYSTOLIC DYSFUNCTION (See above)  MODERATE RV SYSTOLIC DYSFUNCTION (See above)  NO VALVULAR STENOSIS  LARGE PLEURAL EFFUSION  MILD PERICARDIUM EFFUSION  MODERATE TR  MILD MR  TRIVIAL PR  EF 35-40%  Electronically signed by      MD Arnoldo Hooker on 09/23/2022 11: 01 AM   TELEMETRY reviewed by me (LT) 10/17/2022 : AF rate 70s-90s  EKG reviewed by me: AF low voltage rate 87, nonspecific ST or T wave changes  Data reviewed by me (LT) 10/17/2022: admission H&P  CBC BMP I/O vitals telemetry  Principal Problem:   Acute on chronic systolic CHF (congestive heart failure) (HCC) Active Problems:   Benign essential hypertension   Stage 3b chronic kidney disease (HCC)   Hyperkalemia   HLD (hyperlipidemia)   Atrial fibrillation, chronic (HCC)   Obesity (BMI 30.0-34.9)    ASSESSMENT AND  PLAN:  Andrew Benton is a 43yoM with a PMH of NICM (35-40%, mod LV & RV dysfunction, mod TR 09/23/22), atrophic R kidney, CKD 3b, chronic anemia, HTN, HLD, essential tremor who presented to [facility] ED 10-31-2022 with dyspnea on exertion over the past 2 months, with significant peripheral edema, orthopnea, acutely worsening last night.  EKG on admission shows rate controlled atrial fibrillation. Cardiology is consulted for assistance with his heart failure.  # Anasarca # Acute on chronic HFrEF (EF 35-40%) # Pleural effusions Reports a several month history of worsening dyspnea on exertion, peripheral edema, refractory to p.o. Lasix 20 mg daily that was started 1 to 2 months ago.  Over the past 1 to 2 weeks he has had significant limitation in his daily life due to his dyspnea on exertion and progressively worsening anasarca (swelling up into his groin and abdomen) with a 16lb weight gain in the last month alone (95.3kg on admission).  Recent treadmill Myoview without ischemia, but was in atrial fibrillation during this test, but has not yet been addressed to the patient's knowledge (was planned for outpatient follow up re these tests on 12/14).  BNP 500 on admission. Edema  -S/p IV Lasix 40 mg x1 without much clinical improvement. -continue Lasix infusion at 4 mg/h . -Monitor and correct electrolytes as indicated, K+ normalized -Consider therapeutic thoracentesis later in admission  -Hold off on home propranolol LA 60 mg once daily and lisinopril 10 mg daily for now with relative hypotension and AKI -Strict I's/O, daily weights, salt and fluid restriction -discussed importance  of low salt diet in detail with patient and his sons  -Defer repeat echocardiogram complete  # AKI on CKD 3 # hx atrophic R kidney  Baseline creatinine over the past year is 1.7-1.8 with GFR ranged from 37-40.  On admission with significant AKI with BUN/creatinine 96/2.96 and GFR of 21. Minimal improvement (but no worsening) with diuresis.  -Consider early nephrology involvement, especially with his history of R kidney atrophy  # Paroxysmal atrial fibrillation with controlled rate Initially documented during treadmill Myoview on 09/23/22, no hx atrial fibrillation prior to this to the patient's knowledge.  On admission he was in AF with controlled rate around 80 bpm.  Query if this is in any way contributory to his cardiomyopathy. -Continuous telemetry monitoring while inpatient -Plan for rate control strategy for now, with consideration for pharmacologic rhythm control strategy before discharge and DCCV after appropriate AC (4wks)  -Add back propranolol LA as blood pressure allows (currently 90s/70s) -Discussed the risk and benefits of starting anticoagulation for stroke risk reduction. CHA2DS2-VASc of 4 (age, CHF, HTN).  He is agreeable to start a DOAC at discharge.  Will plan for heparin infusion while inpatient with his AKI.  This patient's plan of care was discussed and created with Dr. Juliann Pares and he is in agreement.  Signed: Rebeca Allegra , PA-C 10/17/2022, 8:25 AM [facility]

## 2022-10-17 NOTE — Consult Note (Signed)
ANTICOAGULATION CONSULT NOTE  Pharmacy Consult for heparin drip Indication: atrial fibrillation  Allergies  Allergen Reactions   Primidone     Other reaction(s): Other (See Comments)    Patient Measurements: Height: 5\' 10"  (177.8 cm) Weight: 95.3 kg (210 lb) IBW/kg (Calculated) : 73 Heparin Dosing Weight: 92.5 kg  Vital Signs: Temp: 97.3 F (36.3 C) (12/14 2026) Temp Source: Oral (12/14 1423) BP: 97/78 (12/14 2026) Pulse Rate: 81 (12/14 2026)  Labs: Recent Labs    10/25/2022 0656 10/17/2022 0832 10/18/2022 1150 10/29/2022 2245 10/17/22 0638  HGB 12.9*  --   --   --  11.6*  HCT 40.5  --   --   --  35.6*  PLT 252  --   --   --  218  APTT  --   --  32  --   --   LABPROT  --   --  14.5  --   --   INR  --   --  1.1  --   --   HEPARINUNFRC  --   --   --  0.91* 0.92*  CREATININE 2.96*  --   --   --  2.86*  TROPONINIHS 11 10  --   --   --      Estimated Creatinine Clearance: 25.1 mL/min (A) (by C-G formula based on SCr of 2.86 mg/dL (H)).   Medical History: Past Medical History:  Diagnosis Date   Anemia    Arthritis    a little bit in hands    Hypertension    Kidney stones     Medications:  No home anticoagulants per pharmacist review  Assessment: 76 yo patient presented to ED with increasing SOB over last 1 to 2 months, especially with exertion.  Has PMH of CHF, CKD, HTN.  During work-up was found to be in Atrial fibrillation.  Pharmacy consulted to initiate heparin drip.  Baseline aPTT 32, INR 1.1, Hgb 12.9, plt = 252.   Goal of Therapy:  Heparin level 0.3-0.7 units/ml Monitor platelets by anticoagulation protocol: Yes  Plan: heparin level remains elevated despite recent rate decrease ---reduce heparin infusion rate to 700 units/hr ---Recheck heparin level 8 hours after rate change ---CBC daily while on heparin  62, PharmD, BCPS 10/17/2022 9:05 PM

## 2022-10-17 NOTE — Assessment & Plan Note (Signed)
-  Continue Lipitor °

## 2022-10-17 NOTE — Assessment & Plan Note (Addendum)
Baseline CKD stage IIIb, solitary left kidney. Baseline creatinine over the past year is 1.7-1.8 with GFR ranged from 37-40.  On admission with significant AKI with BUN/creatinine 96/2.96 and GFR of 21. Minimal improvement with diuresis. 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 --Nephrology consulted given need for diuresis --Monitor BMP --Avoid nephrotoxins, hypotension (keep MAP>65)

## 2022-10-17 NOTE — Assessment & Plan Note (Addendum)
12/14: BP on admission was normal, but with diuresis has been significantly hypotensive today. At times, MAP dropping below 65. --Holding Lasix drip --Cardiology updated --12/14 Responded well to 250 cc NS bolus (68/47 >> 85/65 MAP 73) --Holding lisinopril and propranolol --Maintain MAP > 65 --Increase midodrine to 10 mg TID  --Transfer to stepdown

## 2022-10-17 NOTE — Assessment & Plan Note (Addendum)
Currently hypotensive, required small fluid bolus today.  Started on midodrine 12/14.  Increase midodrine to 10 mg TID. Transfer to stepdown. Continue holding propranolol, lisinopril.

## 2022-10-17 NOTE — Consult Note (Signed)
ANTICOAGULATION CONSULT NOTE  Pharmacy Consult for heparin drip Indication: atrial fibrillation  Allergies  Allergen Reactions   Primidone     Other reaction(s): Other (See Comments)    Patient Measurements: Height: 5\' 10"  (177.8 cm) Weight: 95.3 kg (210 lb) IBW/kg (Calculated) : 73 Heparin Dosing Weight: 92.5 kg  Vital Signs: Temp: 97.7 F (36.5 C) (12/14 0941) Temp Source: Oral (12/14 0941) BP: 84/68 (12/14 0830) Pulse Rate: 85 (12/14 0830)  Labs: Recent Labs    11/02/2022 0656 10/07/2022 0832 10/28/2022 1150 10/30/2022 2245 10/17/22 0638  HGB 12.9*  --   --   --  11.6*  HCT 40.5  --   --   --  35.6*  PLT 252  --   --   --  218  APTT  --   --  32  --   --   LABPROT  --   --  14.5  --   --   INR  --   --  1.1  --   --   HEPARINUNFRC  --   --   --  0.91* 0.92*  CREATININE 2.96*  --   --   --  2.86*  TROPONINIHS 11 10  --   --   --      Estimated Creatinine Clearance: 25.1 mL/min (A) (by C-G formula based on SCr of 2.86 mg/dL (H)).   Medical History: Past Medical History:  Diagnosis Date   Anemia    Arthritis    a little bit in hands    Hypertension    Kidney stones     Medications:  No home anticoagulants per pharmacist review  Assessment: 76 yo patient presented to ED with increasing SOB over last 1 to 2 months, especially with exertion.  Has PMH of CHF, CKD, HTN.  During work-up was found to be in Atrial fibrillation.  Pharmacy consulted to initiate heparin drip.  Baseline aPTT 32, INR 1.1, Hgb 12.9, plt = 252.   Goal of Therapy:  Heparin level 0.3-0.7 units/ml Monitor platelets by anticoagulation protocol: Yes   12/13 2245 HL 0.91 supratherapeutic 12/14 0638 HL 0.92 supratherapeutic  Plan:  Decrease heparin infusion to 900 units/hr Recheck HL 8 hours after rate change CBC daily while on heparin  1/15, PharmD 10/17/2022 9:52 AM

## 2022-10-17 NOTE — Assessment & Plan Note (Signed)
No acute flare.  Monitor.  Continue allopurinol.

## 2022-10-18 DIAGNOSIS — N1832 Chronic kidney disease, stage 3b: Secondary | ICD-10-CM | POA: Diagnosis not present

## 2022-10-18 DIAGNOSIS — G9341 Metabolic encephalopathy: Secondary | ICD-10-CM | POA: Diagnosis not present

## 2022-10-18 DIAGNOSIS — I5023 Acute on chronic systolic (congestive) heart failure: Secondary | ICD-10-CM | POA: Diagnosis not present

## 2022-10-18 DIAGNOSIS — N179 Acute kidney failure, unspecified: Secondary | ICD-10-CM | POA: Diagnosis not present

## 2022-10-18 LAB — COMPREHENSIVE METABOLIC PANEL
ALT: 34 U/L (ref 0–44)
AST: 38 U/L (ref 15–41)
Albumin: 3.3 g/dL — ABNORMAL LOW (ref 3.5–5.0)
Alkaline Phosphatase: 174 U/L — ABNORMAL HIGH (ref 38–126)
Anion gap: 9 (ref 5–15)
BUN: 102 mg/dL — ABNORMAL HIGH (ref 8–23)
CO2: 20 mmol/L — ABNORMAL LOW (ref 22–32)
Calcium: 9 mg/dL (ref 8.9–10.3)
Chloride: 109 mmol/L (ref 98–111)
Creatinine, Ser: 2.73 mg/dL — ABNORMAL HIGH (ref 0.61–1.24)
GFR, Estimated: 23 mL/min — ABNORMAL LOW (ref >60)
Glucose, Bld: 108 mg/dL — ABNORMAL HIGH (ref 70–99)
Potassium: 5.1 mmol/L (ref 3.5–5.1)
Sodium: 138 mmol/L (ref 135–145)
Total Bilirubin: 1.3 mg/dL — ABNORMAL HIGH (ref 0.3–1.2)
Total Protein: 7.6 g/dL (ref 6.5–8.1)

## 2022-10-18 LAB — CBC
HCT: 39.3 % (ref 39.0–52.0)
Hemoglobin: 12.6 g/dL — ABNORMAL LOW (ref 13.0–17.0)
MCH: 31.3 pg (ref 26.0–34.0)
MCHC: 32.1 g/dL (ref 30.0–36.0)
MCV: 97.5 fL (ref 80.0–100.0)
Platelets: 245 10*3/uL (ref 150–400)
RBC: 4.03 MIL/uL — ABNORMAL LOW (ref 4.22–5.81)
RDW: 16.6 % — ABNORMAL HIGH (ref 11.5–15.5)
WBC: 7.4 10*3/uL (ref 4.0–10.5)
nRBC: 0 % (ref 0.0–0.2)

## 2022-10-18 LAB — GLUCOSE, CAPILLARY: Glucose-Capillary: 112 mg/dL — ABNORMAL HIGH (ref 70–99)

## 2022-10-18 LAB — AMMONIA: Ammonia: 24 umol/L (ref 9–35)

## 2022-10-18 LAB — LACTIC ACID, PLASMA: Lactic Acid, Venous: 1.9 mmol/L (ref 0.5–1.9)

## 2022-10-18 LAB — HEPARIN LEVEL (UNFRACTIONATED): Heparin Unfractionated: 0.64 IU/mL (ref 0.30–0.70)

## 2022-10-18 LAB — MRSA NEXT GEN BY PCR, NASAL: MRSA by PCR Next Gen: NOT DETECTED

## 2022-10-18 LAB — CORTISOL-AM, BLOOD: Cortisol - AM: 24.8 ug/dL — ABNORMAL HIGH (ref 6.7–22.6)

## 2022-10-18 MED ORDER — AMIODARONE HCL 200 MG PO TABS
200.0000 mg | ORAL_TABLET | Freq: Two times a day (BID) | ORAL | Status: DC
  Administered 2022-10-18 (×2): 200 mg via ORAL
  Filled 2022-10-18 (×2): qty 1

## 2022-10-18 MED ORDER — AMIODARONE HCL 200 MG PO TABS: 200.0000 mg | ORAL_TABLET | Freq: Every day | ORAL | Status: DC

## 2022-10-18 MED ORDER — CHLORHEXIDINE GLUCONATE CLOTH 2 % EX PADS
6.0000 | MEDICATED_PAD | Freq: Every day | CUTANEOUS | Status: DC
  Administered 2022-10-19: 6 via TOPICAL

## 2022-10-18 MED ORDER — APIXABAN 5 MG PO TABS
5.0000 mg | ORAL_TABLET | Freq: Two times a day (BID) | ORAL | Status: DC
  Administered 2022-10-18 (×2): 5 mg via ORAL
  Filled 2022-10-18 (×2): qty 1

## 2022-10-18 MED ORDER — AMIODARONE HCL 200 MG PO TABS: 200.0000 mg | ORAL_TABLET | Freq: Two times a day (BID) | ORAL | Status: DC

## 2022-10-18 MED ORDER — FUROSEMIDE 10 MG/ML IJ SOLN
4.0000 mg/h | INTRAVENOUS | Status: DC
  Filled 2022-10-18: qty 20

## 2022-10-18 MED ORDER — MIDODRINE HCL 5 MG PO TABS
10.0000 mg | ORAL_TABLET | Freq: Three times a day (TID) | ORAL | Status: DC
  Administered 2022-10-18 – 2022-10-19 (×3): 10 mg via ORAL
  Filled 2022-10-18 (×3): qty 2

## 2022-10-18 NOTE — Progress Notes (Cosign Needed Addendum)
[facility] CLINIC CARDIOLOGY CONSULT NOTE       Patient ID: Andrew Benton MRN: [id number] DOB/AGE: [birth year] 77 y.o.  Admit date: 10/06/2022 Referring Physician Dr. Clyde Lundborg Primary Physician Dr. Marcelino Duster Primary Cardiologist Dr. Gwen Pounds / Minda Ditto, PA-C Reason for Consultation AoCHF  HPI: Andrew Benton is a [phone] with a PMH of NICM (35-40%, mod LV & RV dysfunction, mod TR 09/23/22), atrophic R kidney, CKD 3b, chronic anemia, HTN, HLD, essential tremor who presented to [facility] ED 10/09/2022 with dyspnea on exertion over the past 2 months, with significant peripheral edema, orthopnea, acutely worsening last night.  EKG on admission shows rate controlled atrial fibrillation. Cardiology is consulted for assistance with his heart failure.  Interval History:  -Seen and examined PCU with 2 sons at bedside -Did not sleep well due to his neighbor in his shared room. -Was hypotensive midday yesterday, and Lasix infusion stopped, was given a bolus of normal saline and started on midodrine with some improvement in his blood pressure. -In atrial fibrillation on telemetry, rates in the 90s-110s -Denies chest pain, palpitations.  No real improvement in his peripheral edema, still orthopneic -Renal function with continued slight improvement  Review of systems complete and found to be negative unless listed above     Past Medical History:  Diagnosis Date   Anemia    Arthritis    a little bit in hands    Hypertension    Kidney stones     Past Surgical History:  Procedure Laterality Date   COLONOSCOPY  2011   CYSTOSCOPY W/ RETROGRADES  05/19/2020   Procedure: CYSTOSCOPY WITH RETROGRADE PYELOGRAM;  Surgeon: Sondra Come, MD;  Location: [facility] ORS;  Service: Urology;;   CYSTOSCOPY WITH STENT PLACEMENT Left 05/19/2020   Procedure: CYSTOSCOPY WITH STENT PLACEMENT;  Surgeon: Sondra Come, MD;  Location: [facility] ORS;  Service: Urology;  Laterality: Left;    CYSTOSCOPY/URETEROSCOPY/HOLMIUM LASER/STENT PLACEMENT Left 06/02/2020   Procedure: CYSTOSCOPY/URETEROSCOPY/HOLMIUM LASER/STENT PLACEMENT;  Surgeon: Sondra Come, MD;  Location: [facility] ORS;  Service: Urology;  Laterality: Left;   EYE SURGERY  2016   TONSILLECTOMY      Medications Prior to Admission  Medication Sig Dispense Refill Last Dose   allopurinol (ZYLOPRIM) 100 MG tablet Take 100 mg by mouth 2 (two) times daily.   10/15/2022   aspirin EC 81 MG tablet Take 81 mg by mouth daily. Swallow whole.   10/15/2022   atorvastatin (LIPITOR) 10 MG tablet Take 10 mg by mouth every evening.    10/15/2022   furosemide (LASIX) 20 MG tablet Take 20 mg by mouth daily.   10/15/2022   gabapentin (NEURONTIN) 300 MG capsule Take 1 capsule by mouth 2 (two) times daily.   10/15/2022   lisinopril (ZESTRIL) 10 MG tablet Take 10 mg by mouth daily.   10/15/2022   Multiple Vitamin (MULTIVITAMIN WITH MINERALS) TABS tablet Take 1 tablet by mouth daily.   10/15/2022   propranolol ER (INDERAL LA) 60 MG 24 hr capsule Take 60 mg by mouth daily.   10/15/2022   vitamin B-12 (CYANOCOBALAMIN) 1000 MCG tablet Take 1,000 mcg by mouth daily.   10/15/2022   acetaminophen (TYLENOL) 500 MG tablet Take 1,000 mg by mouth every 8 (eight) hours as needed for mild pain.   prn   triamcinolone ointment (KENALOG) 0.1 % Apply topically 2 (two) times daily.   prn    Social History   Socioeconomic History   Marital status: Widowed    Spouse name: Not on  file   Number of children: Not on file   Years of education: Not on file   Highest education level: Not on file  Occupational History   Not on file  Tobacco Use   Smoking status: Never   Smokeless tobacco: Never  Vaping Use   Vaping Use: Never used  Substance and Sexual Activity   Alcohol use: Not Currently   Drug use: Never   Sexual activity: Yes    Birth control/protection: None  Other Topics Concern   Not on file  Social History Narrative   Not on file   Social  Determinants of Health   Financial Resource Strain: Not on file  Food Insecurity: Not on file  Transportation Needs: Not on file  Physical Activity: Not on file  Stress: Not on file  Social Connections: Not on file  Intimate Partner Violence: Not on file    Family History  Problem Relation Age of Onset   Dementia Brother       Intake/Output Summary (Last 24 hours) at 10/18/2022 0922 Last data filed at 10/17/2022 1031 Gross per 24 hour  Intake 13.27 ml  Output --  Net 13.27 ml     Vitals:   10/18/22 0022 10/18/22 0551 10/18/22 0600 10/18/22 0756  BP: 91/67 93/78  96/73  Pulse: 93 (!) 101  92  Resp: 18 18 18 16   Temp: 97.7 F (36.5 C) 97.6 F (36.4 C)  (!) 97.3 F (36.3 C)  TempSrc:      SpO2: 96% 99%  97%  Weight:      Height:        PHYSICAL EXAM General: Pleasant elderly Caucasian male, well nourished, in no acute distress. Sitting upright with legs off bed, two sons at bedside HEENT:  Normocephalic and atraumatic. Neck:  No JVD.  Lungs: Slight conversational dyspnea on room air absent bibasilar breath sounds, no appreciable wheezes. heart: Irregularly irregular with controlled rate. Normal S1 and S2 without gallops or murmurs.  Abdomen: Mildly distended appearing, soft and nontender Msk: Normal strength and tone for age. Extremities:  No clubbing, cyanosis.  Significant 2-3+ pitting edema from his ankles, up to his thighs. Sight softening of upper leg edema.  Neuro: Alert and oriented X 3. Psych:  Answers questions appropriately.   Labs: Basic Metabolic Panel: Recent Labs    10/17/22 0638 10/18/22 0555  NA 139 138  K 5.0 5.1  CL 112* 109  CO2 22 20*  GLUCOSE 113* 108*  BUN 97* 102*  CREATININE 2.86* 2.73*  CALCIUM 8.8* 9.0  MG 2.6*  --     Liver Function Tests: Recent Labs    10/24/2022 0656 10/18/22 0555  AST 44* 38  ALT 37 34  ALKPHOS 190* 174*  BILITOT 1.4* 1.3*  PROT 7.8 7.6  ALBUMIN 3.5 3.3*    No results for input(s): "LIPASE",  "AMYLASE" in the last 72 hours. CBC: Recent Labs    10/27/2022 0656 10/17/22 0638 10/18/22 0555  WBC 7.5 6.6 7.4  NEUTROABS 5.3  --   --   HGB 12.9* 11.6* 12.6*  HCT 40.5 35.6* 39.3  MCV 98.3 96.7 97.5  PLT 252 218 245    Cardiac Enzymes: Recent Labs    10/29/2022 0656 11/03/2022 0832  TROPONINIHS 11 10    BNP: Recent Labs    10/29/2022 0656  BNP 494.2*    D-Dimer: No results for input(s): "DDIMER" in the last 72 hours. Hemoglobin A1C: No results for input(s): "HGBA1C" in the last 72 hours.  Fasting Lipid Panel: No results for input(s): "CHOL", "HDL", "LDLCALC", "TRIG", "CHOLHDL", "LDLDIRECT" in the last 72 hours. Thyroid Function Tests: No results for input(s): "TSH", "T4TOTAL", "T3FREE", "THYROIDAB" in the last 72 hours.  Invalid input(s): "FREET3" Anemia Panel: No results for input(s): "VITAMINB12", "FOLATE", "FERRITIN", "TIBC", "IRON", "RETICCTPCT" in the last 72 hours.   Radiology: US RENAL  Result Date: 05-Nov-2022 CLINICAL DATA:  Acute kidney injury, history hypertension, nephrolithiasis EXAM: RENAL / URINARY TRACT ULTRASOUND COMPLETE COMPARISON:  08/06/2021 FINDINGS: Right Kidney: Renal measurements: At least 10.1 cm length = volume: N/A mL. Severe RIGHT hydronephrosis identified no residual renal cortex visualized. No renal mass. Dilated proximal to mid RIGHT ureter up to 15 mm diameter. Shadowing calculus seen at mid ureter 11 mm diameter. Left Kidney: Renal measurements: 11.9 x 5.9 x 4.3 cm = volume: 159 mL. Tiny simple LEFT renal cyst 10 mm diameter; no follow-up imaging recommended. Normal cortical thickness and echogenicity. No additional mass, hydronephrosis or shadowing calcification. Bladder: Appears normal for degree of bladder distention. Other: Minimal ascites RIGHT upper quadrant.  Small LEFT pleural effusion. IMPRESSION: Marked RIGHT hydronephrosis and proximal to mid RIGHT hydroureter secondary to an 11 mm mid ureteral calculus. Marked RIGHT renal cortical  thinning, with no residual cortex visualized. Minimal LEFT pleural effusion and RIGHT upper quadrant ascites. Electronically Signed   By: Ulyses Southward M.D.   On: 11-05-2022 11:21   DG Chest 2 View  Result Date: 11/05/22 CLINICAL DATA:  Chest pain EXAM: CHEST - 2 VIEW COMPARISON:  05/18/2020 abdominal CT FINDINGS: Pleural effusions, borderline moderate on the left. Pleural fluid obscures the lung bases. No edema or pneumothorax. Possible cardiomegaly. No acute osseous finding. IMPRESSION: Left more than right pleural effusion obscuring the lower lungs. Electronically Signed   By: Tiburcio Pea M.D.   On: 11/05/22 06:47    Treadmill Myoview 09/23/22  Impression  Indeterminant treadmill EKG due to baseline EKG changes and atrial fibrillation Normal myocardial perfusion without evidence of myocardial ischemia  Arnoldo Hooker Narrative  Table formatting from the original result was not included. CARDIOLOGY DEPARTMENT [facility] [address] [place], [place]  [address] [phone]  Procedure: Exercise Myocardial Perfusion Imaging   ONE day procedure  Indication: Cardiomyopathy, nonischemic (CMS-HCC) Plan: NM myocardial perfusion SPECT multiple (stress       and rest), ECG stress test only  SOBOE (shortness of breath on exertion) Plan: NM myocardial perfusion SPECT multiple (stress       and rest), ECG stress test only  Bilateral leg edema Plan: NM myocardial perfusion SPECT multiple (stress       and rest), ECG stress test only  Ordering Physician:  Dr. Arnoldo Hooker  Clinical History: 76 y.o. year old male Vitals: Height: 70 in  Weight: 193 lb Cardiac risk factors include:    CARDIOMYOPATHY, Hyperlipidemia, and HTN  Procedure: The patient performed treadmill exercise using a Bruce protocol for 3:00 minutes. The exercise test was stopped due to fatigue.  Blood pressure response was  MIXED . The patient developed symptoms other  than fatigue during the procedure; specific symptoms included SEVERE MALAISE AND DYSPNEA  Rest HR: 90bpm Rest BP: 142/34mmHg Max HR: 137bpm Max BP: 128/70mmHg Mets:     4.60 % MAX HR:   95%  Stress Test Administered by: Percell Boston, CMA  ECG Interpretation: Rest ECG:  atrial fibrillation, none Stress ECG:  atrial fibrillation,   Recovery ECG:  atrial fibrillation ECG Interpretation:  negative, no ECG changes.  Gated  post-stress perfusion imaging was performed 30 minutes after stress. Rest images were performed 30 minutes after injection.  Gated LV Analysis:  Summary of LV Perfusion: Normal  Summary of LV Function: Normal  TID:  1.03  LVEF= 41%  ECHO 09/23/2022 MODERATE LV SYSTOLIC DYSFUNCTION (See above)  MODERATE RV SYSTOLIC DYSFUNCTION (See above)  NO VALVULAR STENOSIS  LARGE PLEURAL EFFUSION  MILD PERICARDIUM EFFUSION  MODERATE TR  MILD MR  TRIVIAL PR  EF 35-40%  Electronically signed by      MD Arnoldo Hooker on 09/23/2022 11: 01 AM   TELEMETRY reviewed by me (LT) 10/18/2022 : AF rate 90s-110s, some paroxysms to the 120s  EKG reviewed by me: AF low voltage rate 87, nonspecific ST or T wave changes  Data reviewed by me (LT) 10/18/2022: admission H&P CBC BMP I/O vitals telemetry  Principal Problem:   Acute on chronic systolic CHF (congestive heart failure) (HCC) Active Problems:   Acute renal failure (HCC)   Benign essential hypertension   Gout   Peripheral polyneuropathy   Stage 3b chronic kidney disease (HCC)   Hyperkalemia   HLD (hyperlipidemia)   PAF (paroxysmal atrial fibrillation) (HCC)   Obesity (BMI 30.0-34.9)   Hypotension   ASSESSMENT AND PLAN:  Andrew Benton is a 2yoM with a PMH of NICM (35-40%, mod LV & RV dysfunction, mod TR 09/23/22), atrophic R kidney, CKD 3b, chronic anemia, HTN, HLD, essential tremor who presented to [facility] ED 10/27/2022 with dyspnea on exertion over the past 2 months, with significant peripheral edema, orthopnea,  acutely worsening last night.  EKG on admission shows rate controlled atrial fibrillation. Cardiology is consulted for assistance with his heart failure.  # Anasarca # Acute on chronic HFrEF (EF 35-40%) # Pleural effusions Reports a several month history of worsening dyspnea on exertion, peripheral edema, refractory to p.o. Lasix 20 mg daily that was started 1 to 2 months ago.  Over the past 1 to 2 weeks he has had significant limitation in his daily life due to his dyspnea on exertion and progressively worsening anasarca (swelling up into his groin and abdomen) with a 16lb weight gain in the last month alone (95.3kg on admission).  Recent treadmill Myoview without ischemia, but was in atrial fibrillation during this test, but has not yet been addressed to the patient's knowledge (was planned for outpatient follow up re these tests on 12/14).  BNP 500 on admission. Edema  -S/p IV Lasix 40 mg x1 without much clinical improvement. -Restart Lasix infusion at 4 mg/h if BP is >100/60 -Started on midodrine 5 mg 3 times daily by primary team, ideally would wean off this prior to discharge -Monitor and correct electrolytes as indicated, K+ normalized -Consider therapeutic thoracentesis later in admission  -Hold off on home propranolol LA 60 mg once daily and lisinopril 10 mg daily for now with relative hypotension and AKI -Strict I's/O, daily weights, salt and fluid restriction -discussed importance of low salt diet in detail with patient and his sons  -Defer repeat echocardiogram complete  # AKI on CKD 3 # hx atrophic R kidney  Baseline creatinine over the past year is 1.7-1.8 with GFR ranged from 37-40.   -Cr trending 2.96 -- 2.86 -- 2.73, GFR 23 -nephrology following  # Paroxysmal atrial fibrillation with controlled rate Initially documented during treadmill Myoview on 09/23/22, no hx atrial fibrillation prior to this to the patient's knowledge.  On admission he was in AF with controlled rate  around 80 bpm, HR slightly up (  90s-110s) on 12/15 after holding BB d/t low BPs -Continuous telemetry monitoring while inpatient -Start amiodarone 200 mg p.o. twice daily x 7 days, then 200mg  daily thereafter -Consider DCCV after appropriate AC (4wks)  -Add back propranolol LA as blood pressure allows -Discussed the risk and benefits of starting anticoagulation for stroke risk reduction. CHA2DS2-VASc of 4 (age, CHF, HTN).  He is agreeable to start a DOAC. Will switch from heparin gtt to eliquis 5mg  BID today.   This patient's plan of care was discussed and created with Dr. and he is in agreement.  Signed: , PA-C 10/18/2022, 9:22 AM [facility] Cardiology

## 2022-10-18 NOTE — Progress Notes (Signed)
Progress Note   Patient: Andrew Benton GEZ:[id number] DOB: 07-08-1946 DOA: 10/13/2022     2 DOS: the patient was seen and examined on 10/18/2022   Brief hospital course: Andrew Benton is a 76 y.o. male with medical history significant of sCHF with EF 35%, A-fib not on anticoagulants, HTN, HLD, essentially solitary left kidney (as he has a completely atrophic right kidney with severe right hydronephrosis secondary to a large right distal ureteral stone), CKD-3b, tremor, gout, anxiety, who presented on 10/15/2022 for evaluation of worsening shortness of breath and leg edema, in addition to cough mostly at night when laying down.    Of note, patient denies history of atrial fibrillation, but recent stress ECG showed atrial fibrillation.  Patient has been following up with [facility] cardiology, with an appointment on Thursday with cardiology.   Evaluation in the ED was consistent with decompensated heart failure with BNP 494, chest x-ray with bilateral pleural effusions (L>R).    Admitted to medicine service with [facility] cardiology consulted. Started on IV Lasix drip.  12/14 - pt having profound hypotension.  Lasix drip had to be stopped.  Given 250 cc fluid bolus this afternoon.   Assessment and Plan: * Acute on chronic systolic CHF (congestive heart failure) (HCC) Anasarca Pleural effusions --[facility] cardiology following --Lasix drip remains on hold due to hypotension despite midodrine. Attempted to resume this AM but BP worsened --Transfer to stepdown for close monitoring, may need pressors / ionotropes --Consulted nephrology due to profound AKI with solitary kidney and needing diuresis --Strict I/O's, daily weights --Low sodium diet --Monitor renal function and electrolytes --Deferring repeat Echo for now, just done 09/23/22 - LVEF 35-40% with moderate RV systolic dysfunction, moderate TR, mild MR, trivial PR, no valvular stenosis --Will consider thoracentesis  Hypotension 12/14: BP on  admission was normal, but with diuresis has been significantly hypotensive today. At times, MAP dropping below 65. --Holding Lasix drip --Cardiology updated --12/14 Responded well to 250 cc NS bolus (68/47 >> 85/65 MAP 73) --Holding lisinopril and propranolol --Maintain MAP > 65 --Increase midodrine to 10 mg TID  --Transfer to stepdown  Hyperkalemia K on admission was 5.7, improved after Lasix and Lokelma. Monitor BMP   Acute renal failure (HCC) Baseline CKD stage IIIb, solitary left kidney. Baseline creatinine over the past year is 1.7-1.8 with GFR ranged from 37-40.  On admission with significant AKI with BUN/creatinine 96/2.96 and GFR of 21. Minimal improvement with diuresis. 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 --Nephrology consulted given need for diuresis --Monitor BMP --Avoid nephrotoxins, hypotension (keep MAP>65)  PAF (paroxysmal atrial fibrillation) (HCC) HR has been controlled.  [facility] cardiology following. Telemetry monitoring On heparin drip for now. DOAC planned at d/c for anticoagulation Propranolol on hold due to hypotension Cardiology may do cardioversion after 4 weeks uninterrupted anticoagulation.  Stage 3b chronic kidney disease ([place]) See acute renal failure.  Benign essential hypertension Currently hypotensive, required small fluid bolus today.  Started on midodrine 12/14.  Increase midodrine to 10 mg TID. Transfer to stepdown. Continue holding propranolol, lisinopril.  Obesity (BMI 30.0-34.9) Body mass index is 30.13 kg/m. Complicates overall care and prognosis.  Recommend lifestyle modifications including physical activity and diet for weight loss and overall long-term health.   HLD (hyperlipidemia) Continue Lipitor  Gout No acute flare.  Monitor.  Continue allopurinol.  Acute metabolic encephalopathy 65/03: progressively worsening mental status with lethargy. Pt will arouse, follow commands, oriented.  Suspect due to persistent  hypotension. --Check VBG and ammonia level --Neuro checks --Mgmt of underlying  issues as outlined --Delirium precautions --Now in stepdown for closer monitoring and hypotension  Peripheral polyneuropathy Continue gabapentin        Subjective: Pt seen with son at bedside today.  Low BP's ongoing.  Pt more sleepy today and nursing reported worsening lethargy as well.  He wakes up easily and responds appropriately however.  Pt reports feeling weak and tired, endorses lightheadedness when up to the bathroom earlier. No chest pain.  Dyspnea about the same.     Physical Exam: Vitals:   10/18/22 1600 10/18/22 1614 10/18/22 1615 10/18/22 1630  BP: 93/74  (!) 89/78 90/72  Pulse: 96 93 98   Resp: _0 Temp:  (!) 97.1 F (36.2 C)    TempSrc:  Axillary    SpO2: 94% 95% 94%   Weight:      Height:       General exam: awake but very drowsy, no acute distress HEENT: moist mucus membranes, hearing grossly normal  Respiratory system: diminished bases, no wheezes or rhonchi, normal respiratory effort. Cardiovascular system: normal S1/S2, RRR, tense b/l LE edema which extends proximally to lower abdominal wall and groin Gastrointestinal system: soft, NT, ND, no HSM felt, +bowel sounds. Central nervous system: A&O x3. no gross focal neurologic deficits, normal speech Extremities: cool distal LE's, unchanged tense edema of b/l LE's as above Skin: dry, intact, normal temperature Psychiatry: normal mood, congruent affect, judgement and insight appear normal   Data Reviewed:  Notable labs ---  K 5.1, bicarb 22 >> 20, glucose 108, Cr 2.73 from 2.86, alk phos 174, albumin 3.3, Tbili 1.3, hbg 12.6 stable   Family Communication: two sons at bedside on rounds  Disposition: Status is: Inpatient Remains inpatient appropriate because: hypotensive, remains volume overloaded, requiring IV therapies as outlined.   Planned Discharge Destination: Home    Time spent: 60 minutes including  time at bedside and in coordination of care.   Author: Ezekiel Slocumb, DO 10/18/2022 4:41 PM  For on call review [email or web address].

## 2022-10-18 NOTE — Consult Note (Signed)
ANTICOAGULATION CONSULT NOTE  Pharmacy Consult for heparin drip Indication: atrial fibrillation  Allergies  Allergen Reactions   Primidone     Other reaction(s): Other (See Comments)    Patient Measurements: Height: 5\' 10"  (177.8 cm) Weight: 95.3 kg (210 lb) IBW/kg (Calculated) : 73 Heparin Dosing Weight: 92.5 kg  Vital Signs: Temp: 97.6 F (36.4 C) (12/15 0551) BP: 93/78 (12/15 0551) Pulse Rate: 101 (12/15 0551)  Labs: Recent Labs    10-Nov-2022 0656 2022/11/10 0832 11/10/22 1150 November 10, 2022 2245 10/17/22 0638 10/17/22 2010 10/18/22 0555  HGB 12.9*  --   --   --  11.6*  --  12.6*  HCT 40.5  --   --   --  35.6*  --  39.3  PLT 252  --   --   --  218  --  245  APTT  --   --  32  --   --   --   --   LABPROT  --   --  14.5  --   --   --   --   INR  --   --  1.1  --   --   --   --   HEPARINUNFRC  --   --   --    < > 0.92* 0.82* 0.64  CREATININE 2.96*  --   --   --  2.86*  --   --   TROPONINIHS 11 10  --   --   --   --   --    < > = values in this interval not displayed.     Estimated Creatinine Clearance: 25.1 mL/min (A) (by C-G formula based on SCr of 2.86 mg/dL (H)).   Medical History: Past Medical History:  Diagnosis Date   Anemia    Arthritis    a little bit in hands    Hypertension    Kidney stones     Medications:  No home anticoagulants per pharmacist review  Assessment: 76 yo patient presented to ED with increasing SOB over last 1 to 2 months, especially with exertion.  Has PMH of CHF, CKD, HTN.  During work-up was found to be in Atrial fibrillation.  Pharmacy consulted to initiate heparin drip.  Baseline aPTT 32, INR 1.1, Hgb 12.9, plt = 252.   Goal of Therapy:  Heparin level 0.3-0.7 units/ml Monitor platelets by anticoagulation protocol: Yes  Plan: heparin level is therapeutic ---Continue heparin infusion rate at 700 units/hr ---Recheck heparin level 8 hours to confirm ---CBC daily while on heparin  62, PharmD,  [facility] 10/18/2022 6:53 AM

## 2022-10-18 NOTE — Progress Notes (Addendum)
[facility]  ROUNDING NOTE   Subjective:   Andrew Benton is a 76 year old male with past medical conditions including atrial fibrillation, hyperlipidemia, hypertension, systolic heart failure, solitary left kidney, and chronic kidney disease stage IIIb.  Patient presents to the emergency department with shortness of breath and lower extremity edema.  Patient has been admitted for Pleural effusion [J90] Acute on chronic combined systolic and diastolic congestive heart failure (HCC) [I50.43] AKI (acute kidney injury) (HCC) [N17.9] Acute on chronic systolic CHF (congestive heart failure) (HCC) [I50.23]  Patient is known to our practice and is followed outpatient by Dr. Wynelle Link.  Has recently seen Dr. Cherylann Ratel in office for routine follow-up appointment.    Patient seen sitting up in bed, family at bedside Ill-appearing Continues to feel weak Remains on room air  Patient seen later in day, after transfer to ICU for hypotension.    Objective:  Vital signs in last 24 hours:  Temp:  [97.3 F (36.3 C)-98.6 F (37 C)] 97.4 F (36.3 C) (12/15 1120) Pulse Rate:  [72-101] 92 (12/15 1120) Resp:  [16-22] 18 (12/15 1120) BP: (68-115)/(47-78) 92/77 (12/15 1120) SpO2:  [95 %-100 %] 97 % (12/15 1120)  Weight change:  Filed Weights   11/01/2022 0628  Weight: 95.3 kg    Intake/Output: I/O last 3 completed shifts: In: 232.1 [I.V.:232.1] Out: 500 [Urine:500]   Intake/Output this shift:  No intake/output data recorded.  Physical Exam: General: Ill-appearing  Head: Normocephalic, atraumatic. Moist oral mucosal membranes  Eyes: Anicteric  Lungs:  Clear to auscultation, normal effort  Heart: Regular rate and rhythm  Abdomen:  Soft, nontender  Extremities: 1-2+ peripheral edema.  Neurologic: Nonfocal, moving all four extremities  Skin: No lesions  Access: None    Basic Metabolic Panel: Recent Labs  Lab 10/10/2022 0656 10/22/2022 1042 10/30/2022 1521 10/20/2022 1844  10/17/22 0638 10/18/22 0555  NA 140  --   --   --  139 138  K 5.7* 5.4* 5.1 4.8 5.0 5.1  CL 111  --   --   --  112* 109  CO2 23  --   --   --  22 20*  GLUCOSE 115*  --   --   --  113* 108*  BUN 96*  --   --   --  97* 102*  CREATININE 2.96*  --   --   --  2.86* 2.73*  CALCIUM 9.1  --   --   --  8.8* 9.0  MG  --   --   --   --  2.6*  --      Liver Function Tests: Recent Labs  Lab 10/26/2022 0656 10/18/22 0555  AST 44* 38  ALT 37 34  ALKPHOS 190* 174*  BILITOT 1.4* 1.3*  PROT 7.8 7.6  ALBUMIN 3.5 3.3*    No results for input(s): "LIPASE", "AMYLASE" in the last 168 hours. No results for input(s): "AMMONIA" in the last 168 hours.  CBC: Recent Labs  Lab 10/09/2022 0656 10/17/22 0638 10/18/22 0555  WBC 7.5 6.6 7.4  NEUTROABS 5.3  --   --   HGB 12.9* 11.6* 12.6*  HCT 40.5 35.6* 39.3  MCV 98.3 96.7 97.5  PLT 252 218 245     Cardiac Enzymes: No results for input(s): "CKTOTAL", "CKMB", "CKMBINDEX", "TROPONINI" in the last 168 hours.  BNP: Invalid input(s): "POCBNP"  CBG: No results for input(s): "GLUCAP" in the last 168 hours.  Microbiology: Results for orders placed or performed during the hospital  encounter of 10/28/2022  Resp panel by RT-PCR (RSV, Flu A&B, Covid) Anterior Nasal Swab     Status: None   Collection Time: 10/22/2022  9:30 AM   Specimen: Anterior Nasal Swab  Result Value Ref Range Status   SARS Coronavirus 2 by RT PCR NEGATIVE NEGATIVE Final    Comment: (NOTE) SARS-CoV-2 target nucleic acids are NOT DETECTED.  The SARS-CoV-2 RNA is generally detectable in upper respiratory specimens during the acute phase of infection. The lowest concentration of SARS-CoV-2 viral copies this assay can detect is 138 copies/mL. A negative result does not preclude SARS-Cov-2 infection and should not be used as the sole basis for treatment or other patient management decisions. A negative result may occur with  improper specimen collection/handling, submission of  specimen other than nasopharyngeal swab, presence of viral mutation(s) within the areas targeted by this assay, and inadequate number of viral copies(<138 copies/mL). A negative result must be combined with clinical observations, patient history, and epidemiological information. The expected result is Negative.  Fact Sheet for Patients:  BloggerCourse.com  Fact Sheet for Healthcare Providers:  SeriousBroker.it  This test is no t yet approved or cleared by the [place] FDA and  has been authorized for detection and/or diagnosis of SARS-CoV-2 by FDA under an Emergency Use Authorization (EUA). This EUA will remain  in effect (meaning this test can be used) for the duration of the COVID-19 declaration under Section 564(b)(1) of the Act, 21 U.S.C.section 360bbb-3(b)(1), unless the authorization is terminated  or revoked sooner.       Influenza A by PCR NEGATIVE NEGATIVE Final   Influenza B by PCR NEGATIVE NEGATIVE Final    Comment: (NOTE) The Xpert Xpress SARS-CoV-2/FLU/RSV plus assay is intended as an aid in the diagnosis of influenza from Nasopharyngeal swab specimens and should not be used as a sole basis for treatment. Nasal washings and aspirates are unacceptable for Xpert Xpress SARS-CoV-2/FLU/RSV testing.  Fact Sheet for Patients: BloggerCourse.com  Fact Sheet for Healthcare Providers: SeriousBroker.it  This test is not yet approved or cleared by the [place] FDA and has been authorized for detection and/or diagnosis of SARS-CoV-2 by FDA under an Emergency Use Authorization (EUA). This EUA will remain in effect (meaning this test can be used) for the duration of the COVID-19 declaration under Section 564(b)(1) of the Act, 21 U.S.C. section 360bbb-3(b)(1), unless the authorization is terminated or revoked.     Resp Syncytial Virus by PCR NEGATIVE NEGATIVE Final     Comment: (NOTE) Fact Sheet for Patients: BloggerCourse.com  Fact Sheet for Healthcare Providers: SeriousBroker.it  This test is not yet approved or cleared by the [place] FDA and has been authorized for detection and/or diagnosis of SARS-CoV-2 by FDA under an Emergency Use Authorization (EUA). This EUA will remain in effect (meaning this test can be used) for the duration of the COVID-19 declaration under Section 564(b)(1) of the Act, 21 U.S.C. section 360bbb-3(b)(1), unless the authorization is terminated or revoked.  Performed at [facility], [address] [place], [place] [address]     Coagulation Studies: Recent Labs    10/21/2022 1150  LABPROT 14.5  INR 1.1     Urinalysis: Recent Labs    10/22/2022 1356  COLORURINE STRAW*  LABSPEC 1.009  PHURINE 5.0  GLUCOSEU NEGATIVE  HGBUR NEGATIVE  BILIRUBINUR NEGATIVE  KETONESUR NEGATIVE  PROTEINUR NEGATIVE  NITRITE NEGATIVE  LEUKOCYTESUR NEGATIVE       Imaging: No results found.   Medications:    furosemide (  LASIX) 200 mg in dextrose 5 % 100 mL (2 mg/mL) infusion      allopurinol  100 mg Oral BID   amiodarone  200 mg Oral BID   Followed by   Melene Muller ON 10/25/2022] amiodarone  200 mg Oral Daily   apixaban  5 mg Oral BID   atorvastatin  10 mg Oral QPM   cyanocobalamin  1,000 mcg Oral Daily   gabapentin  300 mg Oral BID   midodrine  5 mg Oral TID WC   multivitamin with minerals  1 tablet Oral Daily   acetaminophen, albuterol, dextromethorphan-guaiFENesin, hydrALAZINE, ondansetron (ZOFRAN) IV  Assessment/ Plan:  Mr. Andrew Benton is a 76 y.o.  male with past medical conditions including atrial fibrillation, hyperlipidemia, hypertension, systolic heart failure, solitary left kidney, and chronic kidney disease stage IIIb.  Patient presents to the emergency department with shortness of breath and lower extremity edema.  Patient has been  admitted for Pleural effusion [J90] Acute on chronic combined systolic and diastolic congestive heart failure (HCC) [I50.43] AKI (acute kidney injury) (HCC) [N17.9] Acute on chronic systolic CHF (congestive heart failure) (HCC) [I50.23]   Acute Kidney Injury on chronic kidney disease stage IIIb with baseline creatinine 1.7 and GFR of 41 on 08/13/22.  Acute kidney injury secondary to cardiorenal syndrome Chest x-ray shows shortness of breath.  Renal ultrasound shows nonobstructive cyst, stable presentation.   Creatinine slightly decreased.  Lower extremity edema present.  Agree with cardiology plan to initiate furosemide drip at 4 mg/h.  Discussed with family that there may be a slight increase in creatinine due to initiation of drip, but will monitor closely.  Lab Results  Component Value Date   CREATININE 2.73 (H) 10/18/2022   CREATININE 2.86 (H) 10/17/2022   CREATININE 2.96 (H) Nov 09, 2022   No intake or output data in the 24 hours ending 10/18/22 1213  2. Anemia of chronic kidney disease Lab Results  Component Value Date   HGB 12.6 (L) 10/18/2022   Hemoglobin above target. Monitoring   3. Secondary Hyperparathyroidism: with outpatient labs: PTH 44, phosphorus 3.7, calcium 9.5 on 06/25/22.   Lab Results  Component Value Date   CALCIUM 9.0 10/18/2022    Calcium within desired range.  Will continue to monitor.  4.  Acute on chronic systolic and diastolic heart failure.  Echo completed on 09/23/2022 shows EF 35%.    LOS: 2 Andrew Benton 12/15/202312:13 PM

## 2022-10-18 NOTE — Progress Notes (Signed)
   10/18/22 0900  Assess: MEWS Score  Temp 98.2 F (36.8 C)  BP (!) 88/75 (Cardio informed.  Per verbal - giving midodrine and will re-check prior to restarting Lasix drip)  Pulse Rate 90  ECG Heart Rate (!) 111  Resp (!) 22  Level of Consciousness Alert  SpO2 96 %  O2 Device Room Air  Assess: MEWS Score  MEWS Temp 0  MEWS Systolic 1  MEWS Pulse 2  MEWS RR 1  MEWS LOC 0  MEWS Score 4  MEWS Score Color Red  Assess: if the MEWS score is Yellow or Red  Were vital signs taken at a resting state? Yes  Focused Assessment No change from prior assessment (chronic low BP and High HR. Drs adjusting meds and aware)  Does the patient meet 2 or more of the SIRS criteria? No  MEWS guidelines implemented *See Row Information* Yes  Treat  MEWS Interventions Administered scheduled meds/treatments (Gave chronic PO midodrine and set to start on PO amio at noon.  (Drs. Aware))  Pain Score 0  Take Vital Signs  Increase Vital Sign Frequency  Red: Q 1hr X 4 then Q 4hr X 4, if remains red, continue Q 4hrs  Escalate  MEWS: Escalate Red: discuss with charge nurse/RN and provider, consider discussing with RRT  Notify: Charge Nurse/RN  Name of Charge Nurse/RN Notified Matt - chrg  Date Charge Nurse/RN Notified 10/18/22  Time Charge Nurse/RN Notified 1000  Provider Notification  Provider Name/Title [place] [place] [facility] [place]  Date Provider Notified 10/18/22  Time Provider Notified 1000  Method of Notification Page;Face-to-face  Notification Reason Other (Comment) (Aware - adjusting meds)  Provider response See new orders (Possible re-starting Lasix drip and PO amio at 1200)  Date of Provider Response 10/18/22  Time of Provider Response 1005  Document  Patient Outcome Not stable and remains on department;Other (Comment) (will continue to address)  Progress note created (see row info) Yes  Assess: SIRS CRITERIA  SIRS Temperature  0  SIRS Pulse 1  SIRS Respirations  1  SIRS WBC 0  SIRS Score  Sum  2

## 2022-10-18 NOTE — Assessment & Plan Note (Signed)
12/15: progressively worsening mental status with lethargy. Pt will arouse, follow commands, oriented.  Suspect due to persistent hypotension. --Check VBG and ammonia level --Neuro checks --Mgmt of underlying issues as outlined --Delirium precautions --Now in stepdown for closer monitoring and hypotension

## 2022-10-19 ENCOUNTER — Inpatient Hospital Stay: Payer: Medicare Other

## 2022-10-19 ENCOUNTER — Inpatient Hospital Stay
Admit: 2022-10-19 | Discharge: 2022-10-19 | Disposition: A | Discharge status: Home / Self Care | Payer: Medicare Other | Attending: Pulmonary Disease | Admitting: Pulmonary Disease

## 2022-10-19 ENCOUNTER — Other Ambulatory Visit: Payer: Self-pay

## 2022-10-19 DIAGNOSIS — E875 Hyperkalemia: Secondary | ICD-10-CM

## 2022-10-19 DIAGNOSIS — I5023 Acute on chronic systolic (congestive) heart failure: Secondary | ICD-10-CM | POA: Diagnosis not present

## 2022-10-19 DIAGNOSIS — G9341 Metabolic encephalopathy: Secondary | ICD-10-CM | POA: Diagnosis not present

## 2022-10-19 DIAGNOSIS — N4889 Other specified disorders of penis: Secondary | ICD-10-CM | POA: Diagnosis not present

## 2022-10-19 DIAGNOSIS — J90 Pleural effusion, not elsewhere classified: Secondary | ICD-10-CM | POA: Diagnosis not present

## 2022-10-19 DIAGNOSIS — I5043 Acute on chronic combined systolic (congestive) and diastolic (congestive) heart failure: Secondary | ICD-10-CM

## 2022-10-19 LAB — BLOOD GAS, ARTERIAL
Acid-base deficit: 5.2 mmol/L — ABNORMAL HIGH (ref 0.0–2.0)
Bicarbonate: 21.6 mmol/L (ref 20.0–28.0)
O2 Saturation: 46.3 %
Patient temperature: 37
pCO2 arterial: 46 mmHg (ref 32–48)
pH, Arterial: 7.28 — ABNORMAL LOW (ref 7.35–7.45)
pO2, Arterial: 33 mmHg — CL (ref 83–108)

## 2022-10-19 LAB — POTASSIUM
Potassium: 6 mmol/L — ABNORMAL HIGH (ref 3.5–5.1)
Potassium: 5.9 mmol/L — ABNORMAL HIGH (ref 3.5–5.1)
Potassium: 6 mmol/L — ABNORMAL HIGH (ref 3.5–5.1)

## 2022-10-19 LAB — CBC
HCT: 40.3 % (ref 39.0–52.0)
Hemoglobin: 13.2 g/dL (ref 13.0–17.0)
MCH: 31.5 pg (ref 26.0–34.0)
MCHC: 32.8 g/dL (ref 30.0–36.0)
MCV: 96.2 fL (ref 80.0–100.0)
Platelets: 258 10*3/uL (ref 150–400)
RBC: 4.19 MIL/uL — ABNORMAL LOW (ref 4.22–5.81)
RDW: 16.9 % — ABNORMAL HIGH (ref 11.5–15.5)
WBC: 8.8 10*3/uL (ref 4.0–10.5)
nRBC: 0 % (ref 0.0–0.2)

## 2022-10-19 LAB — BLOOD GAS, VENOUS
Acid-base deficit: 5 mmol/L — ABNORMAL HIGH (ref 0.0–2.0)
Bicarbonate: 22.4 mmol/L (ref 20.0–28.0)
O2 Saturation: 40.2 %
Patient temperature: 37
pCO2, Ven: 50 mmHg (ref 44–60)
pH, Ven: 7.26 (ref 7.25–7.43)
pO2, Ven: 39 mmHg (ref 32–45)

## 2022-10-19 LAB — BASIC METABOLIC PANEL
Anion gap: 12 (ref 5–15)
BUN: 110 mg/dL — ABNORMAL HIGH (ref 8–23)
CO2: 19 mmol/L — ABNORMAL LOW (ref 22–32)
Calcium: 9.5 mg/dL (ref 8.9–10.3)
Chloride: 105 mmol/L (ref 98–111)
Creatinine, Ser: 3.53 mg/dL — ABNORMAL HIGH (ref 0.61–1.24)
GFR, Estimated: 17 mL/min — ABNORMAL LOW (ref >60)
Glucose, Bld: 109 mg/dL — ABNORMAL HIGH (ref 70–99)
Potassium: 5.9 mmol/L — ABNORMAL HIGH (ref 3.5–5.1)
Sodium: 136 mmol/L (ref 135–145)

## 2022-10-19 LAB — GLUCOSE, CAPILLARY: Glucose-Capillary: 142 mg/dL — ABNORMAL HIGH (ref 70–99)

## 2022-10-19 MED ORDER — DEXTROSE 50 % IV SOLN: 1.0000 | Freq: Once | INTRAVENOUS | Status: AC

## 2022-10-19 MED ORDER — FUROSEMIDE 10 MG/ML IJ SOLN
10.0000 mg/h | INTRAVENOUS | Status: DC
  Administered 2022-10-19: 10 mg/h via INTRAVENOUS
  Filled 2022-10-19: qty 20

## 2022-10-19 MED ORDER — PRISMASOL BGK 0/2.5 32-2.5 MEQ/L EC SOLN
Status: DC
  Filled 2022-10-19: qty 5000

## 2022-10-19 MED ORDER — ETOMIDATE 2 MG/ML IV SOLN
INTRAVENOUS | Status: AC
  Filled 2022-10-19: qty 10

## 2022-10-19 MED ORDER — NOREPINEPHRINE 4 MG/250ML-% IV SOLN: 2.0000 ug/min | INTRAVENOUS | Status: DC

## 2022-10-19 MED ORDER — DEXTROSE 50 % IV SOLN
1.0000 | Freq: Once | INTRAVENOUS | Status: AC
  Administered 2022-10-19: 50 mL via INTRAVENOUS
  Filled 2022-10-19: qty 50

## 2022-10-19 MED ORDER — DOBUTAMINE IN D5W 4-5 MG/ML-% IV SOLN
2.5000 ug/kg/min | INTRAVENOUS | Status: DC
  Administered 2022-10-19: 2.5 ug/kg/min via INTRAVENOUS
  Filled 2022-10-19 (×2): qty 250

## 2022-10-19 MED ORDER — FUROSEMIDE 10 MG/ML IJ SOLN
40.0000 mg | Freq: Once | INTRAMUSCULAR | Status: AC
  Administered 2022-10-19: 40 mg via INTRAVENOUS
  Filled 2022-10-19: qty 4

## 2022-10-19 MED ORDER — EPINEPHRINE 1 MG/10ML IJ SOSY
PREFILLED_SYRINGE | INTRAMUSCULAR | Status: AC
  Filled 2022-10-19: qty 10

## 2022-10-19 MED ORDER — FAMOTIDINE IN NACL 20-0.9 MG/50ML-% IV SOLN: 20.0000 mg | INTRAVENOUS | Status: DC

## 2022-10-19 MED ORDER — SODIUM ZIRCONIUM CYCLOSILICATE 5 G PO PACK
10.0000 g | PACK | Freq: Every day | ORAL | Status: AC
  Administered 2022-10-19: 10 g via ORAL
  Filled 2022-10-19: qty 2

## 2022-10-19 MED ORDER — HEPARIN SODIUM (PORCINE) 1000 UNIT/ML DIALYSIS: 1000.0000 [IU] | INTRAMUSCULAR | Status: DC | PRN

## 2022-10-19 MED ORDER — ALBUTEROL SULFATE (2.5 MG/3ML) 0.083% IN NEBU
10.0000 mg | INHALATION_SOLUTION | Freq: Once | RESPIRATORY_TRACT | Status: AC
  Administered 2022-10-19: 10 mg via RESPIRATORY_TRACT
  Filled 2022-10-19: qty 12

## 2022-10-19 MED ORDER — DEXTROSE 50 % IV SOLN
INTRAVENOUS | Status: AC
  Administered 2022-10-19: 50 mL via INTRAVENOUS
  Filled 2022-10-19: qty 50

## 2022-10-19 MED ORDER — AMIODARONE HCL 200 MG PO TABS
200.0000 mg | ORAL_TABLET | Freq: Every day | ORAL | Status: DC
  Administered 2022-10-19: 200 mg via ORAL
  Filled 2022-10-19: qty 1

## 2022-10-19 MED ORDER — INSULIN ASPART 100 UNIT/ML IV SOLN
5.0000 [IU] | Freq: Once | INTRAVENOUS | Status: AC
  Administered 2022-10-19: 5 [IU] via INTRAVENOUS
  Filled 2022-10-19: qty 0.05

## 2022-10-19 MED ORDER — FENTANYL 2500MCG IN NS 250ML (10MCG/ML) PREMIX INFUSION
0.0000 ug/h | INTRAVENOUS | Status: DC
  Filled 2022-10-19: qty 250

## 2022-10-19 MED ORDER — ROCURONIUM BROMIDE 10 MG/ML (PF) SYRINGE
PREFILLED_SYRINGE | INTRAVENOUS | Status: AC
  Filled 2022-10-19: qty 10

## 2022-10-19 MED ORDER — ROCURONIUM BROMIDE 10 MG/ML (PF) SYRINGE: 50.0000 mg | PREFILLED_SYRINGE | Freq: Once | INTRAVENOUS | Status: DC

## 2022-10-19 MED ORDER — EPINEPHRINE HCL 5 MG/250ML IV SOLN IN NS
0.5000 ug/min | INTRAVENOUS | Status: DC
  Filled 2022-10-19: qty 250

## 2022-10-19 MED ORDER — NOREPINEPHRINE 4 MG/250ML-% IV SOLN
INTRAVENOUS | Status: AC
  Administered 2022-10-19: 2 ug/min via INTRAVENOUS
  Filled 2022-10-19: qty 250

## 2022-10-19 MED ORDER — PRISMASOL BGK 0/2.5 32-2.5 MEQ/L EC SOLN
Status: DC
  Filled 2022-10-19 (×3): qty 5000

## 2022-10-19 MED ORDER — ETOMIDATE 2 MG/ML IV SOLN
20.0000 mg | Freq: Once | INTRAVENOUS | Status: AC
  Administered 2022-10-19: 20 mg via INTRAVENOUS

## 2022-10-19 MED ORDER — SODIUM CHLORIDE 0.9 % IV SOLN: 250.0000 mL | INTRAVENOUS | Status: DC

## 2022-10-19 MED ORDER — MIDAZOLAM-SODIUM CHLORIDE 100-0.9 MG/100ML-% IV SOLN
0.5000 mg/h | INTRAVENOUS | Status: DC
  Filled 2022-10-19: qty 100

## 2022-10-19 MED ORDER — FUROSEMIDE 10 MG/ML IJ SOLN: 10.0000 mg/h | INTRAVENOUS | Status: DC

## 2022-10-23 LAB — BLOOD GAS, VENOUS
Acid-base deficit: 4.6 mmol/L — ABNORMAL HIGH (ref 0.0–2.0)
Bicarbonate: 22.9 mmol/L (ref 20.0–28.0)
O2 Saturation: 38.8 %
Patient temperature: 37
pCO2, Ven: 51 mmHg (ref 44–60)
pH, Ven: 7.26 (ref 7.25–7.43)

## 2022-11-04 NOTE — Progress Notes (Signed)
[facility]    SUBJECTIVE: Patient complains of weakness fatigue and lack of energy persistent shortness of breath and dyspnea but denies any pain still has some lower extremity edema   Vitals:   November 04, 2022 0900 11-04-22 0930 11-04-22 1000 2022-11-04 1107  BP: (!) 85/73 95/81 (!) 88/70   Pulse: 88 93  (!) 103  Resp: (!) 22 18 (!) 24 (!) 24  Temp:      TempSrc:      SpO2: 97% 98%  95%  Weight:      Height:         Intake/Output Summary (Last 24 hours) at 11/04/2022 1147 Last data filed at 04-Nov-2022 1139 Gross per 24 hour  Intake 260.12 ml  Output 90 ml  Net 170.12 ml      PHYSICAL EXAM  General: Well developed, well nourished, ill-appearing HEENT:  Normocephalic and atramatic Neck:  No JVD.  Lungs: Diminished bilaterally to auscultation and percussion. Heart: Irregular irregular. Normal S1 and S2 without gallops or murmurs.  Abdomen: Bowel sounds are positive, abdomen soft and non-tender  Msk:  Back normal, normal gait. Normal strength and tone for age. Extremities: No clubbing, cyanosis or edema.   Neuro: Alert and oriented X 3. Psych:  Good affect, responds appropriately   LABS: Basic Metabolic Panel: Recent Labs    10/17/22 0638 10/18/22 0555 11-04-22 0450 11-04-22 0825  NA 139 138 136  --   K 5.0 5.1 5.9* 6.0*  CL 112* 109 105  --   CO2 22 20* 19*  --   GLUCOSE 113* 108* 109*  --   BUN 97* 102* 110*  --   CREATININE 2.86* 2.73* 3.53*  --   CALCIUM 8.8* 9.0 9.5  --   MG 2.6*  --   --   --    Liver Function Tests: Recent Labs    10/18/22 0555  AST 38  ALT 34  ALKPHOS 174*  BILITOT 1.3*  PROT 7.6  ALBUMIN 3.3*   No results for input(s): "LIPASE", "AMYLASE" in the last 72 hours. CBC: Recent Labs    10/18/22 0555 11-04-2022 0450  WBC 7.4 8.8  HGB 12.6* 13.2  HCT 39.3 40.3  MCV 97.5 96.2  PLT 245 258   Cardiac Enzymes: No results for input(s): "CKTOTAL", "CKMB", "CKMBINDEX", "TROPONINI" in the last 72 hours. BNP: Invalid input(s):  "POCBNP" D-Dimer: No results for input(s): "DDIMER" in the last 72 hours. Hemoglobin A1C: No results for input(s): "HGBA1C" in the last 72 hours. Fasting Lipid Panel: No results for input(s): "CHOL", "HDL", "LDLCALC", "TRIG", "CHOLHDL", "LDLDIRECT" in the last 72 hours. Thyroid Function Tests: No results for input(s): "TSH", "T4TOTAL", "T3FREE", "THYROIDAB" in the last 72 hours.  Invalid input(s): "FREET3" Anemia Panel: No results for input(s): "VITAMINB12", "FOLATE", "FERRITIN", "TIBC", "IRON", "RETICCTPCT" in the last 72 hours.  DG Chest Port 1 View  Result Date: 11-04-2022 CLINICAL DATA:  Status post central line placement EXAM: PORTABLE CHEST 1 VIEW COMPARISON:  Previous studies including the examination of 10/21/2022 FINDINGS: Transverse diameter of heart is increased. Central pulmonary vessels are more prominent. There is increased haziness in both mid and lower lung fields. Moderate bilateral pleural effusions are seen. There is no pneumothorax. There is interval placement of right IJ central venous catheter with its tip approximately in the area of right atrium. IMPRESSION: Interval placement of right IJ central venous catheter. There is no pneumothorax. Cardiomegaly. There is increase in bilateral pleural effusions. Evaluation of lower lung fields for infiltrates is limited by  the moderate sized bilateral pleural effusions. Electronically Signed   By: Elmer Picker M.D.   On: 03-Nov-2022 11:24     Echo ending  TELEMETRY: Atrial fibrillation rhythm rate of around 100 nonspecific ST-T changes:  ASSESSMENT AND PLAN:  Principal Problem:   Acute on chronic systolic CHF (congestive heart failure) (HCC) Active Problems:   Acute renal failure (HCC)   Benign essential hypertension   Gout   Peripheral polyneuropathy   Stage 3b chronic kidney disease (HCC)   Hyperkalemia   HLD (hyperlipidemia)   PAF (paroxysmal atrial fibrillation) (HCC)   Obesity (BMI 30.0-34.9)    Hypotension   Acute metabolic encephalopathy    Plan Agree with ICU level care critical care management patient currently on nor epi for blood pressure support as well as midodrine patient states she is feels weak will continue current management Recommend adding dobutamine low-dose to help with inotropic support and hopefully assist with diuresis.  Would only recommend it for limited time 3 to 5 days if no improvement would recommend discontinuing Continue diuresis with Lasix Hypertension she became to be treated with low-dose pressors and midodrine Significant renal insufficiency may be treated with CRRT to help with volume management and control Paroxysmal atrial fibrillation appears to be reasonably managed with oral amiodarone Acute on chronic congestive heart failure echocardiogram EF around 40% again will add low-dose dobutamine to help with antibiotic support poor candidate for ACE ARB or ARB because of renal insufficiency Do not recommend adding Imdur or hydralazine currently because of hypotension   Yolonda Kida, MD 11-03-2022 11:47 AM

## 2022-11-04 NOTE — Progress Notes (Signed)
Brief hospitalist rounding note.  Events of early AM noted, discussed in detail with bedside RN in ICU and cross coverage NP.  Goals of care discussion at bedside with patient this AM, and then with son present.  Patient has worsening renal failure, persistent hypotension despite max dose midodrine, will require intensive care measures.  Case discussed with Intensivist who was also at bedside.  They will assume care as primary service.  Starting patient on Levophed and anticipate nephrology to start CRRT later today.  TRH will remain available to resume care once patient stable.  Goal of care discussion as per IPAL note.    No charge

## 2022-11-04 NOTE — Consult Note (Signed)
Subjective:    Consult requested by Dr. Esaw Grandchild.   I was called to see Andrew Benton to place a foley.  Nursing attempts were unsuccessful secondary to penoscrotal edema.   He has had no prior prostate surgery and was previously voiding well.  His PVR last night was only 5ml and the foley is needed to monitor output.  He has seen Dr. Richardo Hanks in the past for stones and a functionally solitary left kidney.  ROS:  Review of Systems  Unable to perform ROS: Critical illness    Allergies  Allergen Reactions   Primidone     Other reaction(s): Other (See Comments)    Past Medical History:  Diagnosis Date   Anemia    Arthritis    a little bit in hands    Hypertension    Kidney stones     Past Surgical History:  Procedure Laterality Date   COLONOSCOPY  2011   CYSTOSCOPY W/ RETROGRADES  05/19/2020   Procedure: CYSTOSCOPY WITH RETROGRADE PYELOGRAM;  Surgeon: Sondra Come, MD;  Location: [facility];  Service: Urology;;   CYSTOSCOPY WITH STENT PLACEMENT Left 05/19/2020   Procedure: CYSTOSCOPY WITH STENT PLACEMENT;  Surgeon: Sondra Come, MD;  Location: [facility];  Service: Urology;  Laterality: Left;   CYSTOSCOPY/URETEROSCOPY/HOLMIUM LASER/STENT PLACEMENT Left 06/02/2020   Procedure: CYSTOSCOPY/URETEROSCOPY/HOLMIUM LASER/STENT PLACEMENT;  Surgeon: Sondra Come, MD;  Location: [facility];  Service: Urology;  Laterality: Left;   EYE SURGERY  2016   TONSILLECTOMY      Social History   Socioeconomic History   Marital status: Widowed    Spouse name: Not on file   Number of children: Not on file   Years of education: Not on file   Highest education level: Not on file  Occupational History   Not on file  Tobacco Use   Smoking status: Never   Smokeless tobacco: Never  Vaping Use   Vaping Use: Never used  Substance and Sexual Activity   Alcohol use: Not Currently   Drug use: Never   Sexual activity: Yes    Birth control/protection: None  Other Topics Concern   Not on  file  Social History Narrative   Not on file   Social Determinants of Health   Financial Resource Strain: Not on file  Food Insecurity: Not on file  Transportation Needs: Not on file  Physical Activity: Not on file  Stress: Not on file  Social Connections: Not on file  Intimate Partner Violence: Not on file    Family History  Problem Relation Age of Onset   Dementia Brother     Anti-infectives: Anti-infectives (From admission, onward)    None       Current Facility-Administered Medications  Medication Dose Route Frequency Provider Last Rate Last Admin   0.9 %  sodium chloride infusion  250 mL Intravenous Continuous Chawla, Harsh, MD       acetaminophen (TYLENOL) tablet 650 mg  650 mg Oral Q6H PRN Lorretta Harp, MD   650 mg at 10/17/22 0153   albuterol (PROVENTIL) (2.5 MG/3ML) 0.083% nebulizer solution 10 mg  10 mg Nebulization Once Manuela Schwartz, NP       albuterol (PROVENTIL) (2.5 MG/3ML) 0.083% nebulizer solution 2.5 mg  2.5 mg Inhalation Q4H PRN Lorretta Harp, MD       allopurinol (ZYLOPRIM) tablet 100 mg  100 mg Oral BID Lorretta Harp, MD   100 mg at 10/18/22 2124   amiodarone (PACERONE) tablet 200 mg  200 mg Oral  BID Tristan Schroeder, PA-C   200 mg at 10/18/22 2124   Followed by   Derrill Memo ON 10/25/2022] amiodarone (PACERONE) tablet 200 mg  200 mg Oral Daily Tang, Alanson Puls, PA-C       atorvastatin (LIPITOR) tablet 10 mg  10 mg Oral QPM Ivor Costa, MD   10 mg at 10/18/22 1659   Chlorhexidine Gluconate Cloth 2 % PADS 6 each  6 each Topical Q0600 Nicole Kindred A, DO       cyanocobalamin (VITAMIN B12) tablet 1,000 mcg  1,000 mcg Oral Daily Ivor Costa, MD   1,000 mcg at 10/18/22 [id number]   dextromethorphan-guaiFENesin (MUCINEX DM) 30-600 MG per 12 hr tablet 1 tablet  1 tablet Oral BID PRN Ivor Costa, MD       midodrine (PROAMATINE) tablet 10 mg  10 mg Oral TID WC Nicole Kindred A, DO   10 mg at 11-16-22 0757   multivitamin with minerals tablet 1 tablet  1 tablet Oral Daily  Ivor Costa, MD   1 tablet at 10/18/22 0917   norepinephrine (LEVOPHED) 4mg  in 225mL (0.016 mg/mL) premix infusion  2-10 mcg/min Intravenous Titrated Dillard Cannon, Harsh, MD 7.5 mL/hr at November 16, 2022 0812 2 mcg/min at 11/16/22 0812   ondansetron (ZOFRAN) injection 4 mg  4 mg Intravenous Q8H PRN Ivor Costa, MD         Objective: Vital signs in last 24 hours: BP 92/69   Pulse (!) 47   Temp (!) 96.8 F (36 C) (Axillary)   Resp (!) 22   Ht 5\' 10"  (1.778 m)   Wt 96.5 kg   SpO2 90%   BMI 30.53 kg/m   Intake/Output from previous day: 12/15 0701 - 12/16 0700 In: 240 [P.O.:240] Out: -  Intake/Output this shift: No intake/output data recorded.   Physical Exam Vitals reviewed.  Constitutional:      Appearance: He is well-developed. He is not ill-appearing.  Genitourinary:    Comments: Anasarca with an uncircumcised phallus with penoscrotal edema and acquired phimosis as a result.  Neurological:     Mental Status: He is alert.     Lab Results:  Results for orders placed or performed during the hospital encounter of 10/07/2022 (from the past 24 hour(s))  Glucose, capillary     Status: Abnormal   Collection Time: 10/18/22  2:09 PM  Result Value Ref Range   Glucose-Capillary 112 (H) 70 - 99 mg/dL  MRSA Next Gen by PCR, Nasal     Status: None   Collection Time: 10/18/22  2:12 PM   Specimen: Nasal Mucosa; Nasal Swab  Result Value Ref Range   MRSA by PCR Next Gen NOT DETECTED NOT DETECTED  Blood gas, venous     Status: Abnormal (Preliminary result)   Collection Time: 10/18/22  5:13 PM  Result Value Ref Range   pH, Ven 7.26 7.25 - 7.43   pCO2, Ven 51 44 - 60 mmHg   pO2, Ven PENDING 32 - 45 mmHg   Bicarbonate 22.9 20.0 - 28.0 mmol/L   Acid-base deficit 4.6 (H) 0.0 - 2.0 mmol/L   O2 Saturation 38.8 %   Patient temperature 37.0    Collection site VENOUS   Ammonia     Status: None   Collection Time: 10/18/22  5:13 PM  Result Value Ref Range   Ammonia 24 9 - 35 umol/L  Lactic acid, plasma      Status: None   Collection Time: 10/18/22  5:59 PM  Result Value Ref Range  Lactic Acid, Venous 1.9 0.5 - 1.9 mmol/L  Basic metabolic panel     Status: Abnormal   Collection Time: 02-Nov-2022  4:50 AM  Result Value Ref Range   Sodium 136 135 - 145 mmol/L   Potassium 5.9 (H) 3.5 - 5.1 mmol/L   Chloride 105 98 - 111 mmol/L   CO2 19 (L) 22 - 32 mmol/L   Glucose, Bld 109 (H) 70 - 99 mg/dL   BUN 110 (H) 8 - 23 mg/dL   Creatinine, Ser 3.53 (H) 0.61 - 1.24 mg/dL   Calcium 9.5 8.9 - 10.3 mg/dL   GFR, Estimated 17 (L) >60 mL/min   Anion gap 12 5 - 15  CBC     Status: Abnormal   Collection Time: Nov 02, 2022  4:50 AM  Result Value Ref Range   WBC 8.8 4.0 - 10.5 K/uL   RBC 4.19 (L) 4.22 - 5.81 MIL/uL   Hemoglobin 13.2 13.0 - 17.0 g/dL   HCT 40.3 39.0 - 52.0 %   MCV 96.2 80.0 - 100.0 fL   MCH 31.5 26.0 - 34.0 pg   MCHC 32.8 30.0 - 36.0 g/dL   RDW 16.9 (H) 11.5 - 15.5 %   Platelets 258 150 - 400 K/uL   nRBC 0.0 0.0 - 0.2 %  Glucose, capillary     Status: Abnormal   Collection Time: 02-Nov-2022  7:28 AM  Result Value Ref Range   Glucose-Capillary 142 (H) 70 - 99 mg/dL  Blood gas, arterial     Status: Abnormal   Collection Time: 11/02/22  7:41 AM  Result Value Ref Range   Delivery systems ROOM AIR    pH, Arterial 7.28 (L) 7.35 - 7.45   pCO2 arterial 46 32 - 48 mmHg   pO2, Arterial 33 (LL) 83 - 108 mmHg   Bicarbonate 21.6 20.0 - 28.0 mmol/L   Acid-base deficit 5.2 (H) 0.0 - 2.0 mmol/L   O2 Saturation 46.3 %   Patient temperature 37.0    Collection site VENOUS   Potassium     Status: Abnormal   Collection Time: 02-Nov-2022  8:25 AM  Result Value Ref Range   Potassium 6.0 (H) 3.5 - 5.1 mmol/L    BMET Recent Labs    10/18/22 0555 2022-11-02 0450 11-02-2022 0825  NA 138 136  --   K 5.1 5.9* 6.0*  CL 109 105  --   CO2 20* 19*  --   GLUCOSE 108* 109*  --   BUN 102* 110*  --   CREATININE 2.73* 3.53*  --   CALCIUM 9.0 9.5  --    PT/INR Recent Labs    10/04/2022 1150  LABPROT 14.5  INR  1.1   ABG Recent Labs    10/18/22 1713 11/02/22 0741  PHART  --  7.28*  HCO3 22.9 21.6    Studies/Results: No results found. Procedure: Complicated foley placement.  I was able to express the edema fluid out of the foreskin and retract it sufficiently to see the meatus.  He was prepped with betadine and a lubricated 69fr silastic catheter was placed successfully into the bladder.  The balloon was inflated with 36ml and the catheter was left to drainage.   Assessment/Plan: Penoscrotal edema with difficult foley placement.  I was able to successfully place a 9fr foley.  Leave until no longer needed for medical management.         No follow-ups on file.    CC: Dr. Nicole Kindred.  Irine Seal 29-Oct-2022

## 2022-11-04 NOTE — Procedures (Signed)
Central Venous Catheter Insertion Procedure Note Falon Flinchum [id number] 07-24-1946  Procedure: Insertion of Central Venous Catheter Indications: Drug and/or fluid administration  Procedure Details Consent: Risks of procedure as well as the alternatives and risks of each were explained to the (patient/caregiver).  Consent for procedure obtained. Time Out: Verified patient identification, verified procedure, site/side was marked, verified correct patient position, special equipment/implants available, medications/allergies/relevent history reviewed, required imaging and test results available.  Performed  Maximum sterile technique was used including antiseptics, cap, gloves, gown, hand hygiene, mask, and sheet. Skin prep: Chlorhexidine; local anesthetic administered A antimicrobial bonded/coated triple lumen catheter was placed in the right internal jugular vein using the Seldinger technique.  Evaluation Blood flow good Complications: No apparent complications Patient did tolerate procedure well. Chest X-ray ordered to verify placement.  CXR: pending.  Harshini Trent 2022/11/11, 1:12 PM

## 2022-11-04 NOTE — Progress Notes (Signed)
       CROSS COVER NOTE  NAME: Andrew Benton MRN: [id number] DOB : Nov 07, 1945    HPI/Events of Note   Nurse reports K 5.9  Assessment and  Interventions   Assessment:  Latest Reference Range & Units 2022/10/21 04:50  Sodium 135 - 145 mmol/L 136  Potassium 3.5 - 5.1 mmol/L 5.9 (H)  Chloride 98 - 111 mmol/L 105  CO2 22 - 32 mmol/L 19 (L)  Glucose 70 - 99 mg/dL 672 (H)  BUN 8 - 23 mg/dL 094 (H)  Creatinine 7.09 - 1.24 mg/dL 6.28 (H)  Calcium 8.9 - 10.3 mg/dL 9.5  Anion gap 5 - 15  12  (H): Data is abnormally high (L): Data is abnormally low  No rhythm T wave changes Plan: Lokelma 5 units insulin/ d50 Albuterol Did not order fluid bolus or lasix (BP not conducive for lasix drip rate either) Vbg ordered and pending - likely could benefit from sodium bicarb     Donnie Mesa NP [facility]

## 2022-11-04 NOTE — Progress Notes (Signed)
This RN at bedside for initial assessment of patient. Patient responsive to voice but lethargic. Alert and oriented X3, following commands. IV insulin and dextrose administered per orders-see MAR. After administration patient became unresponsive, no loss of pulse or respirations. ICU attending called to bedside to assess patient. RT at bedside as well. Nasal cannula applied. Additional AMP of IV dextrose administered. CBG checked-142. Patient responding to voice and back to baseline during admin of 2nd dextrose. Hospitalist notified and to bedside. Patient's son contacted via telephone who states he will be here very shortly.

## 2022-11-04 NOTE — Procedures (Signed)
Endotracheal Intubation: Patient required placement of an artificial airway secondary to unresponsive state   Consent: Emergent.    Hand washing performed prior to starting the procedure.    Medications administered for sedation prior to procedure:   20 mg Etomidate IV,     A time out procedure was called and correct patient, name, & ID confirmed. Needed supplies and equipment were assembled and checked to include ETT, 10 ml syringe, Glidescope, Mac and Newson blades, suction, oxygen and bag mask valve, end tidal CO2 monitor.    Patient was positioned to align the mouth and pharynx to facilitate visualization of the glottis.    Heart rate, SpO2 and blood pressure was continuously monitored during the procedure. Pre-oxygenation was conducted prior to intubation and endotracheal tube was placed through the vocal cords into the trachea.       The artificial airway was placed under direct visualization via glidescope route using a _7.5___ ETT on the first attempt.   ETT was secured at _25 cm at lip   Placement was confirmed by auscuitation of lungs with good breath sounds bilaterally and no stomach sounds.  Condensation was noted on endotracheal tube.   Pulse ox __94__  CO2 detector in place with appropriate color change.    Complications: None .

## 2022-11-04 NOTE — IPAL (Addendum)
  Interdisciplinary Goals of Care Andrew Meeting   Date carried out: November 07, 2022  Location of the meeting: Bedside  Member's involved: Physician, Bedside Registered Nurse, Social Worker, Andrew Member or next of kin, and Other: Equities trader or Environmental health practitioner: Next of kin (sons)    Discussion: We discussed goals of care for Andrew Benton .   In light of worsening renal failure, minimal urine output, clinical management thus far limited by hypotension, patient will require more aggressive interventions.  Patient stated that he does want dialysis if needed, agreeable to intensive care measures including use of vasopressors, intubation and mechanical ventilation, in they were required.  Goals of care at this time are full code, full scope.  Patient's son Andrew Benton was at bedside during the conversation. I advised patient and Andrew to continue to talk about goals of care together, so sons are aware of his wishes while his mental status is clear so they are prepared to be surrogate decision makers should the need arise.   Code status:   Code Status: Full Code   Disposition: Continue current acute care  Time spent for the meeting: 30    Pennie Banter, DO  11-07-22, 8:07 AM

## 2022-11-04 NOTE — Consult Note (Signed)
Pulmonary and Critical care    NAME:  Andrew Benton, MRN:  [id number], DOB:  02-04-1946, LOS: 3 ADMISSION DATE:  10/11/2022,     History of Present Illness:  The patient is a 77 year old male with history of COPD systolic cardiomyopathy hyperlipidemia hypertension solitary left kidney CKD gout and anxiety.  Patient came with shortness of breath on December 13.  Was found to have significant lower extremity edema.  During this hospitalization patient has worsening renal failure on the morning of December 16 he had syncope which resolved with laying supine.  Likely secondary to low flow state from cardiogenic shock.  Patient was also hypotensive and was started on Levophed.  He was transferred from hospitalist service to the ICU service.  Cardiology has evaluated patient during this hospitalization, was given anticoagulation for atrial fibrillation.  Also on amiodarone initially was on Lasix infusion however this was stopped due to hypotension.  Also required emergent Foley catheter placement by urology secondary to penile edema.  Discussed with patient and son.  Patient will be full code and is agreeable for dialysis if needed.         2022/11/06   12:45 PM 11-06-2022   12:30 PM 2022/11/06   12:15 PM  Vitals with BMI  Systolic 91 91 87  Diastolic 80 78 77  Pulse   119        Examination: General: Somewhat sleepy mildly tachypneic. HENT: JVD about 7 to 8 cm no icterus Lungs: Air entry diminished at bases with axillary crackles.  Resonant to percussion anteriorly no wheezing. Cardiovascular: S1-S2 heard.  Systolic murmur left sternal border no parasternal heave. Abdomen: Abdomen is nontender bowel sounds are heard. Extremities: Significant edema of lower extremities bilaterally including mild penile edema as well as edema of anterior abdominal wall.  And sacral edema Neuro: Moves all extremities to commands awake. GU: Penile edema    Assessment & Plan:  1.  Impending  respiratory failure. 2.  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  Acute on chronic renal failure. 4.  Interstitial pulmonary edema. 5.  Cardiogenic shock 6.  Hyperkalemia. 7.  Syncope and collapse, this happened in the morning patient was unresponsive transiently likely secondary to low flow state from cardiogenic shock. 8.  Paroxysmal atrial fibrillation patient received amiodarone was on anticoagulation this was held for line placement 9.  Metabolic encephalopathy Plan Levophed. Emergent central line was obtained.. Patient received IV Lasix followed by Lasix infusion if no response CRRT will be done. Emergent Foley catheter was placed by urology due to penile edema. Start famotidine Recheck potassium in the afternoon . Anticoagulation stopped for potentially need for dialysis catheter he was on Eliquis. Discontinue gabapentin due to metabolic encephalopathy. Echocardiogram Best Practice (right click and "Reselect all SmartList Selections" daily)   Diet/type: Regular consistency (see orders) DVT prophylaxis: DOAC GI prophylaxis: H2B Lines: yes and it is still needed Foley:  Yes, and it is still needed Code Status:  full code   Labs   CBC: Recent Labs  Lab 10/04/2022 0656 10/17/22 0638 10/18/22 0555 11-06-2022 0450  WBC 7.5 6.6 7.4 8.8  NEUTROABS 5.3  --   --   --   HGB 12.9* 11.6* 12.6* 13.2  HCT 40.5 35.6* 39.3 40.3  MCV 98.3 96.7 97.5 96.2  PLT 252 218 245 258    Basic Metabolic Panel: Recent Labs  Lab 10/27/2022 0656 10/22/2022 1042 10/17/22 0638 10/18/22 0555 11-06-22 0450 11-06-22 0825 2022-11-06 1135  NA 140  --  139 138 136  --   --   K 5.7*   < > 5.0 5.1 5.9* 6.0* 5.9*  CL 111  --  112* 109 105  --   --   CO2 23  --  22 20* 19*  --   --   GLUCOSE 115*  --  113* 108* 109*  --   --   BUN 96*  --  97* 102* 110*  --   --   CREATININE 2.96*  --  2.86* 2.73* 3.53*  --   --   CALCIUM 9.1  --  8.8* 9.0 9.5  --   --   MG  --   --   2.6*  --   --   --   --    < > = values in this interval not displayed.   GFR: Estimated Creatinine Clearance: 20.4 mL/min (A) (by C-G formula based on SCr of 3.53 mg/dL (H)). Recent Labs  Lab Oct 25, 2022 0656 10/17/22 0638 10/18/22 0555 10/18/22 1759 10/27/2022 0450  PROCALCITON 0.12  --   --   --   --   WBC 7.5 6.6 7.4  --  8.8  LATICACIDVEN  --   --   --  1.9  --     Liver Function Tests: Recent Labs  Lab 2022-10-25 0656 10/18/22 0555  AST 44* 38  ALT 37 34  ALKPHOS 190* 174*  BILITOT 1.4* 1.3*  PROT 7.8 7.6  ALBUMIN 3.5 3.3*   No results for input(s): "LIPASE", "AMYLASE" in the last 168 hours. Recent Labs  Lab 10/18/22 1713  AMMONIA 24    ABG    Component Value Date/Time   PHART 7.28 (L) 10/27/2022 0741   PCO2ART 46 11/03/2022 0741   PO2ART 33 (LL) 10/24/2022 0741   HCO3 22.4 10/20/2022 0825   ACIDBASEDEF 5.0 (H) 10/26/2022 0825   O2SAT 40.2 10/30/2022 0825     Coagulation Profile: Recent Labs  Lab October 25, 2022 1150  INR 1.1    Cardiac Enzymes: No results for input(s): "CKTOTAL", "CKMB", "CKMBINDEX", "TROPONINI" in the last 168 hours.  HbA1C: No results found for: "HGBA1C"  CBG: Recent Labs  Lab 10/18/22 1409 10/20/2022 0728  GLUCAP 112* 142*    RADIOLOGY Chest x-ray after central line placement was seen central line is in position with bilateral pleural effusion and interstitial pulmonary edema.  Review of Systems:   Denies chest pain reports shortness of breath.  Denies focal logic weakness reports lethargy reports no nausea or vomiting.  Past Medical History:  He,  has a past medical history of Anemia, Arthritis, Hypertension, and Kidney stones.   Surgical History:   Past Surgical History:  Procedure Laterality Date   COLONOSCOPY  2011   CYSTOSCOPY W/ RETROGRADES  05/19/2020   Procedure: CYSTOSCOPY WITH RETROGRADE PYELOGRAM;  Surgeon: Sondra Come, MD;  Location: [facility];  Service: Urology;;   CYSTOSCOPY WITH STENT PLACEMENT Left  05/19/2020   Procedure: CYSTOSCOPY WITH STENT PLACEMENT;  Surgeon: Sondra Come, MD;  Location: [facility];  Service: Urology;  Laterality: Left;   CYSTOSCOPY/URETEROSCOPY/HOLMIUM LASER/STENT PLACEMENT Left 06/02/2020   Procedure: CYSTOSCOPY/URETEROSCOPY/HOLMIUM LASER/STENT PLACEMENT;  Surgeon: Sondra Come, MD;  Location: [facility];  Service: Urology;  Laterality: Left;   EYE SURGERY  2016   TONSILLECTOMY       Social History:   reports that he has never smoked. He has never used smokeless tobacco. He reports that he does not currently use alcohol. He reports that he does not  use drugs.   Family History:  His family history includes Dementia in his brother.   Allergies Allergies  Allergen Reactions   Primidone     Other reaction(s): Other (See Comments)     Home Medications  Prior to Admission medications   Medication Sig Start Date End Date Taking? Authorizing Provider  allopurinol (ZYLOPRIM) 100 MG tablet Take 100 mg by mouth 2 (two) times daily.   Yes [provider]  aspirin EC 81 MG tablet Take 81 mg by mouth daily. Swallow whole.   Yes [provider]  atorvastatin (LIPITOR) 10 MG tablet Take 10 mg by mouth every evening.    Yes [provider]  furosemide (LASIX) 20 MG tablet Take 20 mg by mouth daily. 08/21/22 08/21/23 Yes [provider]  gabapentin (NEURONTIN) 300 MG capsule Take 1 capsule by mouth 2 (two) times daily. 07/10/22  Yes [provider]  lisinopril (ZESTRIL) 10 MG tablet Take 10 mg by mouth daily. 07/26/21  Yes [provider]  Multiple Vitamin (MULTIVITAMIN WITH MINERALS) TABS tablet Take 1 tablet by mouth daily.   Yes [provider]  propranolol ER (INDERAL LA) 60 MG 24 hr capsule Take 60 mg by mouth daily. 02/05/21  Yes [provider]  vitamin B-12 (CYANOCOBALAMIN) 1000 MCG tablet Take 1,000 mcg by mouth daily.   Yes [provider]  acetaminophen (TYLENOL) 500 MG tablet Take  1,000 mg by mouth every 8 (eight) hours as needed for mild pain.    [provider]  triamcinolone ointment (KENALOG) 0.1 % Apply topically 2 (two) times daily. 10/29/21   [provider]     Critical care time:  I have personally spent __65________ minutes of critical care time, exclusive of time spent on any  procedures, in evaluation and management of this critically ill patient's condition.    Aubree Doody MD ([facility]) FOR  Pulmonary & Critical Care

## 2022-11-04 NOTE — Progress Notes (Deleted)
  Echocardiogram 2D Echocardiogram has been performed.  Lenor Coffin 10/22/2022, 10:24 AM

## 2022-11-04 NOTE — Progress Notes (Addendum)
Chaplain responded to Code Blue, pulse was recovered but pt deteriorated and decision was made by family not to repeat code.  Pt expired quickly.  Chaplain appreciates the presence and quick thinking of the RN; MD and staff who got the family bedside in pt's last moments. Chaplain offered prayer.  Chaplain will get NOK and FH information.  Memory [facility] [place], [place] [phone]  NOK:  Osamu Olguin (son) [address] [place], [place] [address] [phone]   10/11/2022 1500  Clinical Encounter Type  Visited With Patient and family together  Visit Type Initial;Code;Critical Care;Death  Referral From Nurse  Consult/Referral To Chaplain  Spiritual Encounters  Spiritual Needs Prayer  Stress Factors  Patient Stress Factors Major life changes  Family Stress Factors Loss;Major life changes

## 2022-11-04 NOTE — Progress Notes (Signed)
Post intubation patient's heart rate increased into the 130s and then quickly slowed down to the 30's. MD still present at bedside. Pulses lost. Code blue initiated. (See code blue flowsheet). Chaplain attended to family. MD spoke with family and discussed current situation. ROSC achieved but patient unable to maintain oxygen saturation above 60s. Family requested no additional CPR. Patient's heart rate began to drop, family brought to bedside. Time of death called 1534.

## 2022-11-04 NOTE — Death Summary Note (Signed)
DEATH SUMMARY   Patient Details  Name: Andrew Benton MRN: [id number] DOB: December 26, 1945 [id number], Chrystie Nose, MD  Admission/Discharge Information   Admit Date:  November 15, 2022  Date of Death:    Time of Death:    Length of Stay: 3   Principle Cause of death: Cardiogenic shock  Hospital Diagnoses: Principal Problem:   Acute on chronic combined systolic and diastolic congestive heart failure ([place]) Active Problems:   Acute renal failure (HCC)   Benign essential hypertension   Gout   Peripheral polyneuropathy   Stage 3b chronic kidney disease (HCC)   Hyperkalemia   HLD (hyperlipidemia)   PAF (paroxysmal atrial fibrillation) (HCC)   Obesity (BMI 30.0-34.9)   Hypotension   Acute metabolic encephalopathy   Pleural effusion   Hospital Course: The patient is a 77 year old male with history of COPD systolic cardiomyopathy hyperlipidemia hypertension solitary left kidney CKD gout and anxiety. Patient came with shortness of breath on 2023/11/16. Was found to have significant lower extremity edema. During this hospitalization patient has worsening renal failure on the morning of December 16 he had syncope which resolved with laying supine. Likely secondary to low flow state from cardiogenic shock. Patient was also hypotensive and was started on Levophed. He was transferred from hospitalist service to the ICU service. Cardiology has evaluated patient during this hospitalization, was given anticoagulation for atrial fibrillation. Also on amiodarone initially was on Lasix infusion however this was stopped due to hypotension. Also required emergent Foley catheter placement by urology secondary to penile edema. Discussed with patient and son. Patient will be full code and is agreeable for dialysis if needed.  The patient progressively became hypoxic O2 saturation was about 88% on 2 to 3 L nasal cannula.  Mentation was poor, patient was intubated for impending respiratory failure. Later on  patient had CODE BLUE called, CPR was started and performed for about 10 minutes.  Patient received epinephrine calcium, bicarbonate, amiodarone during the code.  Shock was also delivered for ventricular fibrillation. Return of circulation after about 10 to 11 minutes. Both her sons outside the room.  Discussed concern for low oxygen level during the code. Family decided with DNR.  Patient lost pulse again and was pronounced dead at 3:34 PM  Assessment and Plan: * Acute on chronic combined systolic and diastolic congestive heart failure ([place]) Anasarca Pleural effusions --[facility] cardiology following --Lasix drip remains on hold due to hypotension despite midodrine. Attempted to resume this AM but BP worsened --Transfer to stepdown for close monitoring, may need pressors / ionotropes --Consulted nephrology due to profound AKI with solitary kidney and needing diuresis --Strict I/O's, daily weights --Low sodium diet --Monitor renal function and electrolytes --Deferring repeat Echo for now, just done 09/23/22 - LVEF 35-40% with moderate RV systolic dysfunction, moderate TR, mild MR, trivial PR, no valvular stenosis --Will consider thoracentesis  Acute metabolic encephalopathy [id number]: progressively worsening mental status with lethargy. Pt will arouse, follow commands, oriented.  Suspect due to persistent hypotension. --Check VBG and ammonia level --Neuro checks --Mgmt of underlying issues as outlined --Delirium precautions --Now in stepdown for closer monitoring and hypotension  Hypotension 12/14: BP on admission was normal, but with diuresis has been significantly hypotensive today. At times, MAP dropping below 65. --Holding Lasix drip --Cardiology updated --12/14 Responded well to 250 cc NS bolus (68/47 >> 85/65 MAP 73) --Holding lisinopril and propranolol --Maintain MAP > 65 --Increase midodrine to 10 mg TID  --Transfer to stepdown  Obesity (BMI 30.0-34.9) Body mass index is  30.13  kg/m. Complicates overall care and prognosis.  Recommend lifestyle modifications including physical activity and diet for weight loss and overall long-term health.   PAF (paroxysmal atrial fibrillation) (HCC) HR has been controlled.  [facility] cardiology following. Telemetry monitoring On heparin drip for now. DOAC planned at d/c for anticoagulation Propranolol on hold due to hypotension Cardiology may do cardioversion after 4 weeks uninterrupted anticoagulation.  HLD (hyperlipidemia) Continue Lipitor  Hyperkalemia K on admission was 5.7, improved after Lasix and Lokelma. Monitor BMP   Stage 3b chronic kidney disease ([place]) See acute renal failure.  Peripheral polyneuropathy Continue gabapentin  Gout No acute flare.  Monitor.  Continue allopurinol.  Benign essential hypertension Currently hypotensive, required small fluid bolus today.  Started on midodrine 12/14.  Increase midodrine to 10 mg TID. Transfer to stepdown. Continue holding propranolol, lisinopril.  Acute renal failure (HCC) Baseline CKD stage IIIb, solitary left kidney. Baseline creatinine over the past year is 1.7-1.8 with GFR ranged from 37-40.  On admission with significant AKI with BUN/creatinine 96/2.96 and GFR of 21. Minimal improvement with diuresis. 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 --Nephrology consulted given need for diuresis --Monitor BMP --Avoid nephrotoxins, hypotension (keep MAP>65)           The results of significant diagnostics from this hospitalization (including imaging, microbiology, ancillary and laboratory) are listed below for reference.   Significant Diagnostic Studies: DG Chest Port 1 View  Result Date: 10/25/2022 CLINICAL DATA:  Status post central line placement EXAM: PORTABLE CHEST 1 VIEW COMPARISON:  Previous studies including the examination of 11/03/2022 FINDINGS: Transverse diameter of heart is increased. Central pulmonary vessels are more prominent. There is increased  haziness in both mid and lower lung fields. Moderate bilateral pleural effusions are seen. There is no pneumothorax. There is interval placement of right IJ central venous catheter with its tip approximately in the area of right atrium. IMPRESSION: Interval placement of right IJ central venous catheter. There is no pneumothorax. Cardiomegaly. There is increase in bilateral pleural effusions. Evaluation of lower lung fields for infiltrates is limited by the moderate sized bilateral pleural effusions. Electronically Signed   By: Elmer Picker M.D.   On: 10/25/2022 11:24   US RENAL  Result Date: 10/06/2022 CLINICAL DATA:  Acute kidney injury, history hypertension, nephrolithiasis EXAM: RENAL / URINARY TRACT ULTRASOUND COMPLETE COMPARISON:  08/06/2021 FINDINGS: Right Kidney: Renal measurements: At least 10.1 cm length = volume: N/A mL. Severe RIGHT hydronephrosis identified no residual renal cortex visualized. No renal mass. Dilated proximal to mid RIGHT ureter up to 15 mm diameter. Shadowing calculus seen at mid ureter 11 mm diameter. Left Kidney: Renal measurements: 11.9 x 5.9 x 4.3 cm = volume: 159 mL. Tiny simple LEFT renal cyst 10 mm diameter; no follow-up imaging recommended. Normal cortical thickness and echogenicity. No additional mass, hydronephrosis or shadowing calcification. Bladder: Appears normal for degree of bladder distention. Other: Minimal ascites RIGHT upper quadrant.  Small LEFT pleural effusion. IMPRESSION: Marked RIGHT hydronephrosis and proximal to mid RIGHT hydroureter secondary to an 11 mm mid ureteral calculus. Marked RIGHT renal cortical thinning, with no residual cortex visualized. Minimal LEFT pleural effusion and RIGHT upper quadrant ascites. Electronically Signed   By: Lavonia Dana M.D.   On: 10/10/2022 11:21   DG Chest 2 View  Result Date: 10/31/2022 CLINICAL DATA:  Chest pain EXAM: CHEST - 2 VIEW COMPARISON:  05/18/2020 abdominal CT FINDINGS: Pleural effusions, borderline  moderate on the left. Pleural fluid obscures the lung bases. No edema or pneumothorax. Possible cardiomegaly.  No acute osseous finding. IMPRESSION: Left more than right pleural effusion obscuring the lower lungs. Electronically Signed   By: Jorje Guild M.D.   On: 10/13/2022 06:47    Microbiology: Recent Results (from the past 240 hour(s))  Resp panel by RT-PCR (RSV, Flu A&B, Covid) Anterior Nasal Swab     Status: None   Collection Time: 10/18/2022  9:30 AM   Specimen: Anterior Nasal Swab  Result Value Ref Range Status   SARS Coronavirus 2 by RT PCR NEGATIVE NEGATIVE Final    Comment: (NOTE) SARS-CoV-2 target nucleic acids are NOT DETECTED.  The SARS-CoV-2 RNA is generally detectable in upper respiratory specimens during the acute phase of infection. The lowest concentration of SARS-CoV-2 viral copies this assay can detect is 138 copies/mL. A negative result does not preclude SARS-Cov-2 infection and should not be used as the sole basis for treatment or other patient management decisions. A negative result may occur with  improper specimen collection/handling, submission of specimen other than nasopharyngeal swab, presence of viral mutation(s) within the areas targeted by this assay, and inadequate number of viral copies(<138 copies/mL). A negative result must be combined with clinical observations, patient history, and epidemiological information. The expected result is Negative.  Fact Sheet for Patients:  EntrepreneurPulse.com.au  Fact Sheet for Healthcare Providers:  IncredibleEmployment.be  This test is no t yet approved or cleared by the [place] FDA and  has been authorized for detection and/or diagnosis of SARS-CoV-2 by FDA under an Emergency Use Authorization (EUA). This EUA will remain  in effect (meaning this test can be used) for the duration of the COVID-19 declaration under Section 564(b)(1) of the Act, 21 U.S.C.section  360bbb-3(b)(1), unless the authorization is terminated  or revoked sooner.       Influenza A by PCR NEGATIVE NEGATIVE Final   Influenza B by PCR NEGATIVE NEGATIVE Final    Comment: (NOTE) The Xpert Xpress SARS-CoV-2/FLU/RSV plus assay is intended as an aid in the diagnosis of influenza from Nasopharyngeal swab specimens and should not be used as a sole basis for treatment. Nasal washings and aspirates are unacceptable for Xpert Xpress SARS-CoV-2/FLU/RSV testing.  Fact Sheet for Patients: EntrepreneurPulse.com.au  Fact Sheet for Healthcare Providers: IncredibleEmployment.be  This test is not yet approved or cleared by the [place] FDA and has been authorized for detection and/or diagnosis of SARS-CoV-2 by FDA under an Emergency Use Authorization (EUA). This EUA will remain in effect (meaning this test can be used) for the duration of the COVID-19 declaration under Section 564(b)(1) of the Act, 21 U.S.C. section 360bbb-3(b)(1), unless the authorization is terminated or revoked.     Resp Syncytial Virus by PCR NEGATIVE NEGATIVE Final    Comment: (NOTE) Fact Sheet for Patients: EntrepreneurPulse.com.au  Fact Sheet for Healthcare Providers: IncredibleEmployment.be  This test is not yet approved or cleared by the [place] FDA and has been authorized for detection and/or diagnosis of SARS-CoV-2 by FDA under an Emergency Use Authorization (EUA). This EUA will remain in effect (meaning this test can be used) for the duration of the COVID-19 declaration under Section 564(b)(1) of the Act, 21 U.S.C. section 360bbb-3(b)(1), unless the authorization is terminated or revoked.  Performed at [facility], [address] [place],  [address]   MRSA Next Gen by PCR, Nasal     Status: None   Collection Time: 10/18/22  2:12 PM   Specimen: Nasal Mucosa; Nasal Swab  Result Value Ref Range  Status   MRSA by PCR Next  Gen NOT DETECTED NOT DETECTED Final    Comment: (NOTE) The GeneXpert MRSA Assay (FDA approved for NASAL specimens only), is one component of a comprehensive MRSA colonization surveillance program. It is not intended to diagnose MRSA infection nor to guide or monitor treatment for MRSA infections. Test performance is not FDA approved in patients less than 56 years old. Performed at [facility], [address], [place], [place] [address]     T Signed: Glorianne Manchester, MD 11/01/2022

## 2022-11-04 NOTE — Progress Notes (Signed)
[facility]  PROGRESS NOTE   Subjective:   Patient seen at bedside.  Entire family at bedside.   Urology placed Foley catheter.  Urine output was 250 cc after Foley. Patient is awake and alert denies any chest pain or shortness of breath. He was hyperkalemic and was given insulin and glucose and also Lokelma this morning.  Objective:  Vital signs: Blood pressure (!) 83/75, pulse 93, temperature (!) 96.8 F (36 C), temperature source Axillary, resp. rate 15, height 5\' 10"  (1.778 m), weight 96.5 kg, SpO2 95 %.  Intake/Output Summary (Last 24 hours) at 11-06-22 1348 Last data filed at 11-06-22 1252 Gross per 24 hour  Intake 260.12 ml  Output 90 ml  Net 170.12 ml   Filed Weights   10/22/2022 0628 10/18/22 1409  Weight: 95.3 kg 96.5 kg     Physical Exam: General:  No acute distress  Head:  Normocephalic, atraumatic. Moist oral mucosal membranes  Eyes:  Anicteric  Neck:  Supple  Lungs:   Clear to auscultation, normal effort  Heart:  S1S2 no rubs  Abdomen:   Soft, nontender, bowel sounds present  Extremities: 2+ peripheral edema.  Neurologic:  Awake, alert, following commands  Skin:  No lesions  Access:     Basic Metabolic Panel: Recent Labs  Lab 10/26/2022 0656 11/03/2022 1042 10/17/22 0638 10/18/22 0555 11/06/22 0450 November 06, 2022 0825 11/06/2022 1135  NA 140  --  139 138 136  --   --   K 5.7*   < > 5.0 5.1 5.9* 6.0* 5.9*  CL 111  --  112* 109 105  --   --   CO2 23  --  22 20* 19*  --   --   GLUCOSE 115*  --  113* 108* 109*  --   --   BUN 96*  --  97* 102* 110*  --   --   CREATININE 2.96*  --  2.86* 2.73* 3.53*  --   --   CALCIUM 9.1  --  8.8* 9.0 9.5  --   --   MG  --   --  2.6*  --   --   --   --    < > = values in this interval not displayed.    CBC: Recent Labs  Lab 10/11/2022 0656 10/17/22 0638 10/18/22 0555 2022-11-06 0450  WBC 7.5 6.6 7.4 8.8  NEUTROABS 5.3  --   --   --   HGB 12.9* 11.6* 12.6* 13.2  HCT 40.5 35.6* 39.3 40.3  MCV 98.3 96.7  97.5 96.2  PLT 252 218 245 258     Urinalysis: Recent Labs    10/15/2022 1356  COLORURINE STRAW*  LABSPEC 1.009  PHURINE 5.0  GLUCOSEU NEGATIVE  HGBUR NEGATIVE  BILIRUBINUR NEGATIVE  KETONESUR NEGATIVE  PROTEINUR NEGATIVE  NITRITE NEGATIVE  LEUKOCYTESUR NEGATIVE      Imaging: DG Chest Port 1 View  Result Date: 06-Nov-2022 CLINICAL DATA:  Status post central line placement EXAM: PORTABLE CHEST 1 VIEW COMPARISON:  Previous studies including the examination of 10/17/2022 FINDINGS: Transverse diameter of heart is increased. Central pulmonary vessels are more prominent. There is increased haziness in both mid and lower lung fields. Moderate bilateral pleural effusions are seen. There is no pneumothorax. There is interval placement of right IJ central venous catheter with its tip approximately in the area of right atrium. IMPRESSION: Interval placement of right IJ central venous catheter. There is no pneumothorax. Cardiomegaly. There is increase in bilateral pleural effusions.  Evaluation of lower lung fields for infiltrates is limited by the moderate sized bilateral pleural effusions. Electronically Signed   By: Ernie Avena M.D.   On: 11/02/2022 11:24     Medications:    sodium chloride     DOBUTamine     famotidine (PEPCID) IV     furosemide (LASIX) 200 mg in dextrose 5 % 100 mL (2 mg/mL) infusion 10 mg/hr (10/08/2022 1149)   norepinephrine (LEVOPHED) Adult infusion 4 mcg/min (10/24/2022 1010)    allopurinol  100 mg Oral BID   amiodarone  200 mg Oral Daily   atorvastatin  10 mg Oral QPM   Chlorhexidine Gluconate Cloth  6 each Topical Q0600   cyanocobalamin  1,000 mcg Oral Daily   midodrine  10 mg Oral TID WC   multivitamin with minerals  1 tablet Oral Daily    Assessment/ Plan:     Principal Problem:   Acute on chronic combined systolic and diastolic congestive heart failure (HCC) Active Problems:   Acute renal failure (HCC)   Benign essential hypertension   Gout    Peripheral polyneuropathy   Stage 3b chronic kidney disease (HCC)   Hyperkalemia   HLD (hyperlipidemia)   PAF (paroxysmal atrial fibrillation) (HCC)   Obesity (BMI 30.0-34.9)   Hypotension   Acute metabolic encephalopathy   Pleural effusion  Andrew Benton is a 77 year old male with past medical conditions including atrial fibrillation, hyperlipidemia, hypertension, systolic heart failure, solitary left kidney, and chronic kidney disease stage IIIb.  Patient presents to the emergency department with shortness of breath and lower extremity edema.  Patient has been admitted for Pleural effusion [J90] Acute on chronic combined systolic and diastolic congestive heart failure (HCC) [I50.43] AKI (acute kidney injury) (HCC) [N17.9] Acute on chronic systolic CHF (congestive heart failure) (HCC) [I50.23]  #1: Acute kidney injury on chronic kidney disease: Patient with worsening renal insufficiency possibly due to decreased cardiac output leading to hypoperfusion.  Patient also is hypotensive and if continues to decline will initiate on CRRT tonight.  #2: Hyperkalemia: S/p sodium bicarbonate, insulin and Lokelma.  Will continue to monitor repeat potassium level every 6 hours..  #3: Congestive heart failure: Will give Lasix 40 mg IV followed by Lasix drip at 40 mg an hour and monitor urine output.  If the urine output does not improve will initiate on CRRT tonight.  #4: Anemia: Anemia is most likely secondary to chronic kidney disease.  We will continue to monitor closely  I discussed with ICU attending and also family at bedside in detail.  They are agreeable for the plan.   LOS: 3 Lorain Childes, MD [facility] 12/16/20231:48 PM

## 2022-11-04 DEATH — deceased

## 2022-11-05 ENCOUNTER — Encounter: Payer: Medicare Other | Admitting: Family

## 2023-09-11 ENCOUNTER — Ambulatory Visit: Payer: Medicare Other | Admitting: Urology
